# Patient Record
Sex: Male | Born: 1975 | Race: White | Hispanic: No | Marital: Married | Smoking: Never smoker
Health system: Southern US, Community
[De-identification: ages and names within clinical notes are randomized; demographics above are authoritative.]

## PROBLEM LIST (undated history)

## (undated) DIAGNOSIS — I639 Cerebral infarction, unspecified: Secondary | ICD-10-CM

## (undated) DIAGNOSIS — E11319 Type 2 diabetes mellitus with unspecified diabetic retinopathy without macular edema: Secondary | ICD-10-CM

## (undated) DIAGNOSIS — R2990 Unspecified symptoms and signs involving the nervous system: Secondary | ICD-10-CM

## (undated) DIAGNOSIS — I10 Essential (primary) hypertension: Secondary | ICD-10-CM

## (undated) DIAGNOSIS — E119 Type 2 diabetes mellitus without complications: Secondary | ICD-10-CM

## (undated) HISTORY — DX: Type 2 diabetes mellitus with unspecified diabetic retinopathy without macular edema: E11.319

## (undated) HISTORY — DX: Essential (primary) hypertension: I10

## (undated) HISTORY — DX: Cerebral infarction, unspecified: I63.9

## (undated) HISTORY — PX: CORNEAL CHELATION: SHX1399

---

## 2003-02-24 ENCOUNTER — Inpatient Hospital Stay (HOSPITAL_COMMUNITY): Admission: EM | Admit: 2003-02-24 | Discharge: 2003-02-27 | Payer: Self-pay | Admitting: Psychiatry

## 2003-03-05 ENCOUNTER — Inpatient Hospital Stay (HOSPITAL_COMMUNITY): Admission: EM | Admit: 2003-03-05 | Discharge: 2003-03-12 | Payer: Self-pay | Admitting: Psychiatry

## 2003-03-13 ENCOUNTER — Other Ambulatory Visit (HOSPITAL_COMMUNITY): Admission: RE | Admit: 2003-03-13 | Discharge: 2003-03-16 | Payer: Self-pay | Admitting: Psychiatry

## 2018-05-02 DIAGNOSIS — E119 Type 2 diabetes mellitus without complications: Secondary | ICD-10-CM

## 2018-09-08 DIAGNOSIS — I639 Cerebral infarction, unspecified: Secondary | ICD-10-CM

## 2018-09-08 HISTORY — DX: Cerebral infarction, unspecified: I63.9

## 2018-09-29 ENCOUNTER — Inpatient Hospital Stay (HOSPITAL_COMMUNITY): Payer: BLUE CROSS/BLUE SHIELD

## 2018-09-29 ENCOUNTER — Inpatient Hospital Stay (HOSPITAL_COMMUNITY)
Admission: AD | Admit: 2018-09-29 | Discharge: 2018-09-30 | DRG: 069 | Disposition: A | Discharge status: Home / Self Care | Payer: BLUE CROSS/BLUE SHIELD | Source: Other Acute Inpatient Hospital | Attending: Student in an Organized Health Care Education/Training Program | Admitting: Student in an Organized Health Care Education/Training Program

## 2018-09-29 DIAGNOSIS — E119 Type 2 diabetes mellitus without complications: Secondary | ICD-10-CM

## 2018-09-29 DIAGNOSIS — Z9282 Status post administration of tPA (rtPA) in a different facility within the last 24 hours prior to admission to current facility: Secondary | ICD-10-CM

## 2018-09-29 DIAGNOSIS — Z79899 Other long term (current) drug therapy: Secondary | ICD-10-CM | POA: Diagnosis not present

## 2018-09-29 DIAGNOSIS — E785 Hyperlipidemia, unspecified: Secondary | ICD-10-CM

## 2018-09-29 DIAGNOSIS — Z6833 Body mass index (BMI) 33.0-33.9, adult: Secondary | ICD-10-CM

## 2018-09-29 DIAGNOSIS — E669 Obesity, unspecified: Secondary | ICD-10-CM | POA: Diagnosis not present

## 2018-09-29 DIAGNOSIS — G459 Transient cerebral ischemic attack, unspecified: Secondary | ICD-10-CM

## 2018-09-29 DIAGNOSIS — G43909 Migraine, unspecified, not intractable, without status migrainosus: Secondary | ICD-10-CM | POA: Diagnosis not present

## 2018-09-29 DIAGNOSIS — I361 Nonrheumatic tricuspid (valve) insufficiency: Secondary | ICD-10-CM | POA: Diagnosis not present

## 2018-09-29 DIAGNOSIS — I639 Cerebral infarction, unspecified: Secondary | ICD-10-CM

## 2018-09-29 DIAGNOSIS — R2990 Unspecified symptoms and signs involving the nervous system: Secondary | ICD-10-CM | POA: Diagnosis present

## 2018-09-29 DIAGNOSIS — I370 Nonrheumatic pulmonary valve stenosis: Secondary | ICD-10-CM | POA: Diagnosis not present

## 2018-09-29 HISTORY — DX: Unspecified symptoms and signs involving the nervous system: R29.90

## 2018-09-29 HISTORY — DX: Type 2 diabetes mellitus without complications: E11.9

## 2018-09-29 HISTORY — DX: Cerebral infarction, unspecified: I63.9

## 2018-09-29 LAB — MRSA PCR SCREENING: MRSA by PCR: NEGATIVE

## 2018-09-29 MED ORDER — ACETAMINOPHEN 325 MG PO TABS
650.0000 mg | ORAL_TABLET | ORAL | Status: DC | PRN
  Administered 2018-09-29: 650 mg via ORAL
  Filled 2018-09-29: qty 2

## 2018-09-29 MED ORDER — ACETAMINOPHEN 160 MG/5ML PO SOLN: 650.0000 mg | ORAL | Status: DC | PRN

## 2018-09-29 MED ORDER — SENNOSIDES-DOCUSATE SODIUM 8.6-50 MG PO TABS: 1.0000 | ORAL_TABLET | Freq: Every evening | ORAL | Status: DC | PRN

## 2018-09-29 MED ORDER — STROKE: EARLY STAGES OF RECOVERY BOOK
Freq: Once | Status: AC
  Administered 2018-09-29: 20:00:00
  Filled 2018-09-29: qty 1

## 2018-09-29 MED ORDER — PANTOPRAZOLE SODIUM 40 MG IV SOLR
40.0000 mg | Freq: Every day | INTRAVENOUS | Status: DC
  Administered 2018-09-29: 40 mg via INTRAVENOUS
  Filled 2018-09-29: qty 40

## 2018-09-29 MED ORDER — IOPAMIDOL (ISOVUE-370) INJECTION 76%
100.0000 mL | Freq: Once | INTRAVENOUS | Status: AC | PRN
  Administered 2018-09-29: 100 mL via INTRAVENOUS

## 2018-09-29 MED ORDER — SODIUM CHLORIDE 0.9 % IV SOLN
INTRAVENOUS | Status: DC
  Administered 2018-09-29: 1000 mL via INTRAVENOUS
  Administered 2018-09-30: 11:00:00 via INTRAVENOUS

## 2018-09-29 MED ORDER — ACETAMINOPHEN 650 MG RE SUPP: 650.0000 mg | RECTAL | Status: DC | PRN

## 2018-09-29 NOTE — H&P (Addendum)
STROKE Post tPA Admission H&P   CC: Left-sided numbness and weakness  History is obtained from: Patient and chart  HPI: Alexander Mccormick is a 42 y.o. male past medical history of diabetes, who presented to the emergency room at [facility] for sudden onset of left-sided tingling, left upper and lower extremity heaviness, which had worsened today.  He reports he has been having some left-sided tingling and numbness for the past 3 weeks but today at around 10:45 AM he noticed a sudden worsening.  He also had per report a metallic taste in his mouth at the time.  He also complained of headache that at the emergency room at [facility] 6 out of 10. A code stroke was activated because of acute neurological change.  He was evaluated by telemedicine neurology and had a NIH stroke scale of 4-3 for left leg and 1 for sensation- discussion about IV TPA was conducted per notes by the telemedicine neurologist and the patient agreed for IV TPA.  He was given IV TPA and sent over to [facility] for post TPA care. He reports that he is not someone who gets migraines or headaches frequently.  He is a Naval [employer].  Does not smoke.  Denies drugs or alcohol history.  Denies recent stressors.  Denies chest pain shortness of breath nausea vomiting fevers chills or preceding illnesses.  His left-sided symptoms have been ongoing for 3 weeks but they were not as bothersome as they became around 1045 today for which she came to the ER.  He describes the symptoms today as "same feeling when your leg sleeps if you sit on it"-and described both tingling and numbness on the left arm and leg.  No facial involvement.  No speech involvement.  No family history of strokes in young   LKW: 1045 hrs 09/29/2018 tpa given?:  Adult hospital by telemedicine neurology at 1424 hrs. Premorbid modified Rankin scale (mRS): 0  ROS: ROS was performed and is negative except as noted in the HPI.  No past medical history on  file. Diabetes  No family history on file. No family history of strokes or heart attacks  Social History:   has no history on file for tobacco, alcohol, and drug. Denies alcohol tobacco or drug use  Medications  Current Facility-Administered Medications:  .   stroke: mapping our early stages of recovery book, , Does not apply, Once, Milon Dikes, MD .  0.9 %  sodium chloride infusion, , Intravenous, Continuous, Milon Dikes, MD .  acetaminophen (TYLENOL) tablet 650 mg, 650 mg, Oral, Q4H PRN **OR** acetaminophen (TYLENOL) solution 650 mg, 650 mg, Per Tube, Q4H PRN **OR** acetaminophen (TYLENOL) suppository 650 mg, 650 mg, Rectal, Q4H PRN, Milon Dikes, MD .  pantoprazole (PROTONIX) injection 40 mg, 40 mg, Intravenous, QHS, Milon Dikes, MD .  senna-docusate (Senokot-S) tablet 1 tablet, 1 tablet, Oral, QHS PRN, Milon Dikes, MD  Exam: Current vital signs: BP (!) 127/96   Pulse (!) 109   Resp (!) 34   SpO2 94%  Vital signs in last 24 hours: Pulse Rate:  [104-109] 109 (12/22 1945) Resp:  [22-34] 34 (12/22 1945) BP: (127-133)/(92-96) 127/96 (12/22 1945) SpO2:  [94 %-95 %] 94 % (12/22 1945)  GENERAL: Awake, alert in NAD HEENT: - Normocephalic and atraumatic, dry mm, no LN++, no Thyromegally LUNGS - Clear to auscultation bilaterally with no wheezes CV - S1S2 RRR, no m/r/g, equal pulses bilaterally. ABDOMEN - Soft, nontender, nondistended with normoactive BS Ext: warm, well perfused, intact  peripheral pulses, no edema  NEURO:  Mental Status: AA&Ox3  Language: speech is non-dysarthric.  Naming, repetition, fluency, and comprehension intact. Cranial Nerves: PERRL. EOMI, visual fields full, no facial asymmetry, decreased to touch pinprick and vibration with a sharp cut off in the midline.  Intact to temperature.  Hearing intact, tongue/uvula/soft palate midline, normal sternocleidomastoid and trapezius muscle strength. No evidence of tongue atrophy or fibrillations Motor: Left  upper extremity 4/5 with no vertical drift on coaching.  Left lower extremity barely antigravity and falls to the bed before 5 seconds.  Right upper and lower extremities normal strength tone bulk and range of motion.  Caveat-there is effort dependent weakness on the left side. Sensation-decreased sensation left arm and leg compared to the right to light touch, vibration and pinprick.  Temperature exam is inconsistent.  Sensation is decreased to all modalities with a sharp cut off in the midline. Also on the lower extremity complains of sharply decreased sensation on the left leg from his toes up to mid calf and then some improvement in sensation.-Very inconsistent exam Coordination: Finger-to-nose intact bilaterally heel-knee-shin intact on the right, unable to do on the left. Gait- deferred No clonus in bilateral lower extremities 1+ ankle jerks.  Absent knee jerks bilaterally.  Absent biceps and brachioradialis reflexes.  NIHSS 1a Level of Conscious.: 0 1b LOC Questions: 0 1c LOC Commands: 0 2 Best Gaze: 0 3 Visual: 0 4 Facial Palsy: 0 5a Motor Arm - left: 0 5b Motor Arm - Right: 0 6a Motor Leg - Left: 2 6b Motor Leg - Right: 0 7 Limb Ataxia: 0 8 Sensory: 2 9 Best Language: 0 10 Dysarthria: 0 11 Extinct. and Inatten.: 0 TOTAL: 4  Labs I have reviewed labs in epic and the results pertinent to this consultation are: Labs available from our hospital yet. Chart review from [facility] with labs as below: WBC 6.3, hemoglobin 16.9, hematocrit 49.8, platelet count 279, INR 1.0, PT 10.3, APTT 26.1. Sodium 139 potassium 4.5, chloride 102, anion gap 15, carbon dioxide 27 BUN 18 creatinine 1.0 estimated GFR greater than 60 glucose 114 calcium 9.6 total bilirubin 0.6 AST 28 ALT 26 alkaline phosphatase 93 troponin less than 0.01 total protein 8.3 albumin 4.7  Imaging I have reviewed the images obtained: [facility].  No bleed  Assessment:  42 year old man past  history of diabetes presenting [facility] with acute worsening of 3 weeks worth of left-sided tingling and numbness that happened around 10:45 AM. He was seen by telemedicine neurology and IV TPA was given for left-sided weakness on exam with an NIH stroke scale of 4. On my examination, he does have some left lower extremity weakness, and decreased sensation on the left but the exam has some inconsistencies. Also had some headache associated with the symptoms in the morning. Differentials include complex migraine versus stroke versus conversion He has been given IV TPA and we will pursue with post TPA care for now  Imaging by MRI will be useful in establishing a diagnosis  Impression: Evaluate for acute ischemic stroke-status post IV TPA at an outside hospital Left-sided weakness Diabetes Evaluate for complex migraine Evaluate for nonorganic causes of weakness  Plan:  Acute Ischemic Stroke-likely small vessel etiology versus complex migraine  Acuity: Acute Continue Evaluation:  -Admit to: Neuro ICU -Hold Aspirin until 24 hour post tPA neuroimaging is stable and without evidence of bleeding -Blood pressure control, goal of SYS < -MRI/ECHO/A1C/Lipid panel. CTA head neck routine -  Hyperglycemia management per SSI to maintain glucose 140-180mg /dL. -PT/OT/ST therapies and recommendations when able  CNS -Close neuro monitoring -N.p.o. until cleared by bedside swallow evaluation  Hemiplegia and hemiparesis following cerebral infarction affecting left non-dominant side  -PT/OT -PM&R consult  RESP No acute issues Monitor clinically  CV No acute issues Blood pressures per post TPA protocol systolic less than 180  Hyperlipidemia, unspecified  - Statin for goal LDL < 70  HEME Active issues -Monitor -transfuse for hgb < 7  ENDO Type 2 diabetes mellitus with hyperglycemia  -SSI -goal HgbA1c < 7  GI/GU -Gentle hydration  Fluid/Electrolyte Disorders No active  issues Trend labs and replete as necessary  ID No clinical complaint suggestive of any infection Obtain chest x-ray as aspiration pneumonia was remains a concern in stroke patients  Prophylaxis DVT: SCDs   GI: NA Bowel:doc senaa   Diet: NPO until cleared by speech or bedside swallow eval  Code Status: Full Code   THE FOLLOWING WERE PRESENT ON ADMISSION: Acute ischemic stroke Hemiparesis Diabetes  -- Milon DikesAshish Niquita Digioia, MD [facility] Pager: [phone] If 7pm to 7am, please call on call as listed on AMION.   CRITICAL CARE ATTESTATION Performed by: Milon DikesAshish Aunna Snooks, MD Total critical care time: 45 minutes Critical care time was exclusive of separately billable procedures and treating other patients and/or supervising APPs/Residents/Students Critical care was necessary to treat or prevent imminent or life-threatening deterioration due to acute ischemic stroke, left hemiparesis. This patient is critically ill and at significant risk for neurological worsening and/or death and care requires constant monitoring. Critical care was time spent personally by me on the following activities: development of treatment plan with patient and/or surrogate as well as nursing, discussions with consultants, evaluation of patient's response to treatment, examination of patient, obtaining history from patient or surrogate, ordering and performing treatments and interventions, ordering and review of laboratory studies, ordering and review of radiographic studies, pulse oximetry, re-evaluation of patient's condition, participation in multidisciplinary rounds and medical decision making of high complexity in the care of this patient.

## 2018-09-30 ENCOUNTER — Other Ambulatory Visit: Payer: Self-pay

## 2018-09-30 ENCOUNTER — Inpatient Hospital Stay (HOSPITAL_COMMUNITY): Payer: BLUE CROSS/BLUE SHIELD

## 2018-09-30 ENCOUNTER — Encounter (HOSPITAL_COMMUNITY): Payer: Self-pay | Admitting: Nurse Practitioner

## 2018-09-30 DIAGNOSIS — I370 Nonrheumatic pulmonary valve stenosis: Secondary | ICD-10-CM

## 2018-09-30 DIAGNOSIS — E785 Hyperlipidemia, unspecified: Secondary | ICD-10-CM

## 2018-09-30 DIAGNOSIS — E119 Type 2 diabetes mellitus without complications: Secondary | ICD-10-CM

## 2018-09-30 DIAGNOSIS — I361 Nonrheumatic tricuspid (valve) insufficiency: Secondary | ICD-10-CM

## 2018-09-30 DIAGNOSIS — G43909 Migraine, unspecified, not intractable, without status migrainosus: Secondary | ICD-10-CM | POA: Diagnosis present

## 2018-09-30 LAB — GLUCOSE, CAPILLARY
GLUCOSE-CAPILLARY: 82 mg/dL (ref 70–99)
Glucose-Capillary: 107 mg/dL — ABNORMAL HIGH (ref 70–99)
Glucose-Capillary: 118 mg/dL — ABNORMAL HIGH (ref 70–99)

## 2018-09-30 LAB — HEMOGLOBIN A1C
Hgb A1c MFr Bld: 6.2 % — ABNORMAL HIGH (ref 4.8–5.6)
Mean Plasma Glucose: 131.24 mg/dL

## 2018-09-30 LAB — LIPID PANEL
CHOLESTEROL: 137 mg/dL (ref 0–200)
HDL: 39 mg/dL — ABNORMAL LOW (ref >40)
LDL Cholesterol: 85 mg/dL (ref 0–99)
Total CHOL/HDL Ratio: 3.5 RATIO
Triglycerides: 63 mg/dL (ref <150)
VLDL: 13 mg/dL (ref 0–40)

## 2018-09-30 LAB — ECHOCARDIOGRAM COMPLETE
Height: 66 in
Weight: 3343.94 oz

## 2018-09-30 LAB — HIV ANTIBODY (ROUTINE TESTING W REFLEX): HIV Screen 4th Generation wRfx: NONREACTIVE

## 2018-09-30 MED ORDER — ASPIRIN 81 MG PO TBEC: 81.0000 mg | DELAYED_RELEASE_TABLET | Freq: Every day | ORAL | Status: AC

## 2018-09-30 MED ORDER — PRAVASTATIN SODIUM 40 MG PO TABS
40.0000 mg | ORAL_TABLET | Freq: Every day | ORAL | Status: DC
  Administered 2018-09-30: 40 mg via ORAL
  Filled 2018-09-30: qty 1

## 2018-09-30 MED ORDER — PERFLUTREN LIPID MICROSPHERE
1.0000 mL | INTRAVENOUS | Status: AC | PRN
  Administered 2018-09-30: 3 mL via INTRAVENOUS
  Filled 2018-09-30: qty 10

## 2018-09-30 MED ORDER — ASPIRIN EC 81 MG PO TBEC
81.0000 mg | DELAYED_RELEASE_TABLET | Freq: Every day | ORAL | Status: DC
  Administered 2018-09-30: 81 mg via ORAL
  Filled 2018-09-30: qty 1

## 2018-09-30 NOTE — Progress Notes (Addendum)
OT Cancellation Note  Patient Details Name: Alexander Mccormick MRN: [id number] DOB: 29-Feb-1976   Cancelled Treatment:    Reason Eval/Treat Not Completed: Patient not medically ready(TPA 12/22 1424 strict bedrest orders)  OT order received and appreciated however this conflicts with current bedrest order set. Please increase activity tolerance as appropriate and remove bedrest from orders. . Please contact OT at [phone] if bed rest order is discontinued. OT will hold evaluation at this time and will check back as time allows pending increased activity orders.   Fontaine NoBrynn Jabril Pursell   Brynn Feliberto Stockley, OTR/L  Acute Rehabilitation Services Pager: [phone] Office: [phone] .  09/30/2018, 7:42 AM

## 2018-09-30 NOTE — Progress Notes (Signed)
SLP Cancellation Note  Patient Details Name: Alexander Mccormick MRN: [id number] DOB: Dec 26, 1975   Cancelled treatment:       Reason Eval/Treat Not Completed: Patient at procedure or test/unavailable   Blenda MountsCouture, Shana Younge Laurice 09/30/2018, 11:18 AM

## 2018-09-30 NOTE — Evaluation (Signed)
Occupational Therapy Evaluation Patient Details Name: Alexander Mccormick MRN: [id number] DOB: [birth year] Today's Date: 09/30/2018    History of Present Illness 42 year old man past history of diabetes [facility] with acute worsening of 3 weeks worth of left-sided tingling and numbness that happened around 10:45 AM.   Clinical Impression   PT admitted with see above ( pending results on MRI ). Pt currently with functional limitiations due to the deficits listed below (see OT problem list). Pt with L side weakness and decr coordination of L UE.  Pt will benefit from skilled OT to increase their independence and safety with adls and balance to allow discharge outpatient.     Follow Up Recommendations  Outpatient OT    Equipment Recommendations  None recommended by OT    Recommendations for Other Services       Precautions / Restrictions Precautions Precautions: Fall Restrictions Weight Bearing Restrictions: No      Mobility Bed Mobility Overal bed mobility: Modified Independent             General bed mobility comments: no difficulty getting up from level bed  Transfers Overall transfer level: Needs assistance Equipment used: None Transfers: Sit to/from Stand Sit to Stand: Min assist         General transfer comment: pt with L knee instability intially    Balance Overall balance assessment: Mild deficits observed, not formally tested                               Standardized Balance Assessment Standardized Balance Assessment : Dynamic Gait Index   Dynamic Gait Index Level Surface: Mild Impairment Change in Gait Speed: Mild Impairment Gait with Horizontal Head Turns: Mild Impairment Gait with Vertical Head Turns: Mild Impairment Gait and Pivot Turn: Mild Impairment Step Over Obstacle: Mild Impairment Step Around Obstacles: Mild Impairment Steps: Mild Impairment Total Score: 16     ADL either performed or assessed with  clinical judgement   ADL Overall ADL's : Needs assistance/impaired Eating/Feeding: Set up;Sitting Eating/Feeding Details (indicate cue type and reason): decr ability to open Grooming: Wash/dry face;Set up   Upper Body Bathing: Set up   Lower Body Bathing: Min guard           Toilet Transfer: Minimal assistance           Functional mobility during ADLs: Minimal assistance       Vision         Perception     Praxis      Pertinent Vitals/Pain Pain Assessment: No/denies pain     Hand Dominance Right   Extremity/Trunk Assessment Upper Extremity Assessment Upper Extremity Assessment: LUE deficits/detail LUE Deficits / Details: decr fine motor gross motor, decr coordination, undershooting, incr time to touch hand to nose LUE Sensation: WNL LUE Coordination: decreased fine motor;decreased gross motor   Lower Extremity Assessment Lower Extremity Assessment: Defer to PT evaluation LLE Deficits / Details: grossly 3/5 LLE Coordination: decreased gross motor   Cervical / Trunk Assessment Cervical / Trunk Assessment: Normal   Communication Communication Communication: No difficulties   Cognition Arousal/Alertness: Awake/alert Behavior During Therapy: WFL for tasks assessed/performed Overall Cognitive Status: Within Functional Limits for tasks assessed                                     General Comments  VSS  Exercises     Shoulder Instructions      Home Living Family/patient expects to be discharged to:: Private residence Living Arrangements: Spouse/significant other Available Help at Discharge: Family;Available 24 hours/day(can be 24/7, wife does work) Type of Home: House Home Access: Stairs to enter Secretary/administratorntrance Stairs-Number of Steps: 2 Entrance Stairs-Rails: None Home Layout: One level     Bathroom Shower/Tub: Chief Strategy OfficerTub/shower unit   Bathroom Toilet: Standard     Home Equipment: None          Prior Functioning/Environment Level  of Independence: Independent        Comments: drive truck locally        OT Problem List: Decreased strength;Decreased range of motion;Decreased activity tolerance;Impaired balance (sitting and/or standing);Impaired UE functional use      OT Treatment/Interventions: Self-care/ADL training;Therapeutic exercise;Neuromuscular education;DME and/or AE instruction;Manual therapy;Therapeutic activities;Balance training;Patient/family education    OT Goals(Current goals can be found in the care plan section) Acute Rehab OT Goals Patient Stated Goal: home OT Goal Formulation: With patient Time For Goal Achievement: 10/14/18 Potential to Achieve Goals: Good  OT Frequency: Min 2X/week   Barriers to D/C:            Co-evaluation PT/OT/SLP Co-Evaluation/Treatment: Yes Reason for Co-Treatment: For patient/therapist safety   OT goals addressed during session: ADL's and self-care;Proper use of Adaptive equipment and DME;Strengthening/ROM      AM-PAC OT "6 Clicks" Daily Activity     Outcome Measure Help from another person eating meals?: A Little Help from another person taking care of personal grooming?: A Little Help from another person toileting, which includes using toliet, bedpan, or urinal?: A Little Help from another person bathing (including washing, rinsing, drying)?: A Little Help from another person to put on and taking off regular upper body clothing?: A Little Help from another person to put on and taking off regular lower body clothing?: A Little 6 Click Score: 18   End of Session Equipment Utilized During Treatment: Gait belt Nurse Communication: Mobility status;Precautions  Activity Tolerance: Patient tolerated treatment well Patient left: in chair;with call bell/phone within reach;with chair alarm set  OT Visit Diagnosis: Unsteadiness on feet (R26.81);Muscle weakness (generalized) (M62.81)                Time: [id number] OT Time Calculation (min): 22 min Charges:   OT General Charges $OT Visit: 1 Visit OT Evaluation $OT Eval Moderate Complexity: 1 Mod   Alexander Mccormick, OTR/L  Acute Rehabilitation Services Pager: [phone] Office: [phone] .   Alexander FlowBrynn Khadim Lundberg 09/30/2018, 1:56 PM

## 2018-09-30 NOTE — Evaluation (Signed)
Physical Therapy Evaluation Patient Details Name: Alexander Mccormick MRN: [id number] DOB: 1976/05/08 Today's Date: 09/30/2018   History of Present Illness  42 year old man past history of diabetes [facility] with acute worsening of 3 weeks worth of left-sided tingling and numbness that happened around 10:45 AM.  Clinical Impression  Pt presenting with L UE and LE weakness, impaired co-ordination, sequence and reaction time. Pt is able to ambulate without AD and complete stair negotiation. Pt was indep and driving truck PTA. Pt to benefit from outpt if L UE/LE doesn't improve by the end of the week. Acute PT to cont to follow.    Follow Up Recommendations Outpatient PT;Supervision/Assistance - 24 hour(24/7 initially)    Equipment Recommendations  None recommended by PT    Recommendations for Other Services       Precautions / Restrictions Precautions Precautions: Fall Restrictions Weight Bearing Restrictions: No      Mobility  Bed Mobility Overal bed mobility: Modified Independent             General bed mobility comments: no difficulty getting up from level bed  Transfers Overall transfer level: Needs assistance Equipment used: None Transfers: Sit to/from Stand Sit to Stand: Min assist         General transfer comment: pt with L knee instability intially  Ambulation/Gait Ambulation/Gait assistance: Min guard;Min assist Gait Distance (Feet): 200 Feet Assistive device: None Gait Pattern/deviations: Step-through pattern;Decreased stride length Gait velocity: dec Gait velocity interpretation: >2.62 ft/sec, indicative of community ambulatory General Gait Details: pt initially with L knee instability requiring min/modA to prevent L knee buckling initially progressing to ambulation without AD or assist, without L knee buckling  Stairs Stairs: Yes Stairs assistance: Min guard Stair Management: Step to pattern;Forwards Number of Stairs: 2(to mimic  home set up) General stair comments: educated on ascending with R LE and descending with L LE  Wheelchair Mobility    Modified Rankin (Stroke Patients Only) Modified Rankin (Stroke Patients Only) Pre-Morbid Rankin Score: No significant disability Modified Rankin: Slight disability     Balance                                 Standardized Balance Assessment Standardized Balance Assessment : Dynamic Gait Index   Dynamic Gait Index Level Surface: Mild Impairment Change in Gait Speed: Mild Impairment Gait with Horizontal Head Turns: Mild Impairment Gait with Vertical Head Turns: Mild Impairment Gait and Pivot Turn: Mild Impairment Step Over Obstacle: Mild Impairment Step Around Obstacles: Mild Impairment Steps: Mild Impairment Total Score: 16       Pertinent Vitals/Pain Pain Assessment: No/denies pain    Home Living Family/patient expects to be discharged to:: Private residence Living Arrangements: Spouse/significant other Available Help at Discharge: Family;Available 24 hours/day(can be 24/7, wife does work) Type of Home: House Home Access: Stairs to enter Entrance Stairs-Rails: None Secretary/administratorntrance Stairs-Number of Steps: 2 Home Layout: One level Home Equipment: None      Prior Function Level of Independence: Independent         Comments: drive truck locally     Hand Dominance   Dominant Hand: Right    Extremity/Trunk Assessment   Upper Extremity Assessment Upper Extremity Assessment: Defer to OT evaluation    Lower Extremity Assessment Lower Extremity Assessment: LLE deficits/detail LLE Deficits / Details: grossly 3/5 LLE Coordination: decreased gross motor    Cervical / Trunk Assessment Cervical / Trunk Assessment: Normal  Communication  Communication: No difficulties  Cognition Arousal/Alertness: Awake/alert Behavior During Therapy: WFL for tasks assessed/performed Overall Cognitive Status: Within Functional Limits for tasks  assessed                                        General Comments General comments (skin integrity, edema, etc.): VSS    Exercises     Assessment/Plan    PT Assessment Patient needs continued PT services  PT Problem List Decreased strength;Decreased balance;Decreased mobility;Decreased coordination       PT Treatment Interventions DME instruction;Gait training;Stair training;Therapeutic exercise;Therapeutic activities;Functional mobility training;Balance training;Neuromuscular re-education    PT Goals (Current goals can be found in the Care Plan section)  Acute Rehab PT Goals Patient Stated Goal: home PT Goal Formulation: With patient Time For Goal Achievement: 10/07/18 Potential to Achieve Goals: Good Additional Goals Additional Goal #1: Pt to score >49 on Berg to indicate minimal falls risk.    Frequency Min 4X/week   Barriers to discharge        Co-evaluation PT/OT/SLP Co-Evaluation/Treatment: Yes Reason for Co-Treatment: Complexity of the patient's impairments (multi-system involvement)           AM-PAC PT "6 Clicks" Mobility  Outcome Measure Help needed turning from your back to your side while in a flat bed without using bedrails?: A Little Help needed moving from lying on your back to sitting on the side of a flat bed without using bedrails?: A Little Help needed moving to and from a bed to a chair (including a wheelchair)?: A Little Help needed standing up from a chair using your arms (e.g., wheelchair or bedside chair)?: A Little Help needed to walk in hospital room?: A Little Help needed climbing 3-5 steps with a railing? : A Little 6 Click Score: 18    End of Session Equipment Utilized During Treatment: Gait belt Activity Tolerance: Patient tolerated treatment well Patient left: in chair;with call bell/phone within reach Nurse Communication: Mobility status PT Visit Diagnosis: Unsteadiness on feet (R26.81)    Time: 1253-1316 PT  Time Calculation (min) (ACUTE ONLY): 23 min   Charges:   PT Evaluation $PT Eval Moderate Complexity: 1 Mod          Alexander Mccormick, PT, DPT Acute Rehabilitation Services Pager #: [phone] Office #: [phone]   Alexander Mccormick 09/30/2018, 1:37 PM

## 2018-09-30 NOTE — Discharge Summary (Addendum)
Discharge Summary  Patient ID: Alexander Mccormick    l   MRN: [id number]      DOB: 02-02-1976  Date of Admission: 09/29/2018 Date of Discharge: 09/30/2018  Attending Physician:  Milon DikesArora, Ashish, MD, Stroke MD Consultant(s):    None  Patient's PCP:  No primary care provider on file.  DISCHARGE DIAGNOSIS:  Principal Problem:   Stroke-like episode (HCC) s/p IV tPA Active Problems:   Diabetes (HCC)   Hyperlipemia   History reviewed. No pertinent past medical history. History reviewed. No pertinent surgical history.  Allergies as of 09/30/2018      Reactions   Sulfamethoxazole-trimethoprim Rash      Medication List    TAKE these medications   aspirin 81 MG EC tablet Take 1 tablet (81 mg total) by mouth daily.   cholecalciferol 25 MCG (1000 UT) tablet Commonly known as:  VITAMIN D Take 1,000 Units by mouth daily.   pioglitazone 45 MG tablet Commonly known as:  ACTOS Take 45 mg by mouth daily.   pravastatin 40 MG tablet Commonly known as:  PRAVACHOL Take 40 mg by mouth daily.   XIGDUO XR 02-999 MG Tb24 Generic drug:  Dapagliflozin-metFORMIN HCl ER Take 1 tablet by mouth daily.       LABORATORY STUDIES CBC No results found for: WBC, RBC, HGB, HCT, PLT, MCV, MCH, MCHC, RDW, LYMPHSABS, MONOABS, EOSABS, BASOSABS CMPNo results found for: NA, K, CL, CO2, GLUCOSE, BUN, CREATININE, CALCIUM, PROT, ALBUMIN, AST, ALT, ALKPHOS, BILITOT, GFRNONAA, GFRAA COAGSNo results found for: INR, PROTIME Lipid Panel    Component Value Date/Time   CHOL 137 09/30/2018 0106   TRIG 63 09/30/2018 0106   HDL 39 (L) 09/30/2018 0106   CHOLHDL 3.5 09/30/2018 0106   VLDL 13 09/30/2018 0106   LDLCALC 85 09/30/2018 0106   HgbA1C  Lab Results  Component Value Date   HGBA1C 6.2 (H) 09/30/2018   Urinalysis No results found for: COLORURINE, APPEARANCEUR, LABSPEC, PHURINE, GLUCOSEU, HGBUR, BILIRUBINUR, KETONESUR, PROTEINUR, UROBILINOGEN, NITRITE, LEUKOCYTESUR Urine Drug Screen No results found for:  LABOPIA, COCAINSCRNUR, LABBENZ, AMPHETMU, THCU, LABBARB  Alcohol Level No results found for: [facility]   SIGNIFICANT DIAGNOSTIC STUDIES Ct Angio Head W Or Wo Contrast  Result Date: 09/30/2018 CLINICAL DATA:  LEFT extremity numbness and tingling, neck pain. EXAM: CT ANGIOGRAPHY HEAD AND NECK TECHNIQUE: Multidetector CT imaging of the head and neck was performed using the standard protocol during bolus administration of intravenous contrast. Multiplanar CT image reconstructions and MIPs were obtained to evaluate the vascular anatomy. Carotid stenosis measurements (when applicable) are obtained utilizing NASCET criteria, using the distal internal carotid diameter as the denominator. CONTRAST:  100mL ISOVUE-370 IOPAMIDOL (ISOVUE-370) INJECTION 76% COMPARISON:  CT HEAD September 29, 2018 at 1339 hours FINDINGS: CT HEAD FINDINGS BRAIN: No intraparenchymal hemorrhage, mass effect nor midline shift. The ventricles and sulci are normal. No acute large vascular territory infarcts. No abnormal extra-axial fluid collections. Basal cisterns are patent. VASCULAR: Unremarkable. SKULL/SOFT TISSUES: No skull fracture. RIGHT frontal calvarial hemangioma. No significant soft tissue swelling. ORBITS/SINUSES: The included ocular globes and orbital contents are normal.Mild paranasal sinus mucosal thickening. Mastoid air cells are well aerated. OTHER: None. CTA NECK FINDINGS: AORTIC ARCH: Normal appearance of the thoracic arch, normal branch pattern. The origins of the innominate, left Common carotid artery and subclavian artery are widely patent. RIGHT CAROTID SYSTEM: Common carotid artery is patent. Normal appearance of the carotid bifurcation without hemodynamically significant stenosis by NASCET criteria. Normal appearance of the internal carotid artery. LEFT CAROTID SYSTEM: Common  carotid artery is patent. Normal appearance of the carotid bifurcation without hemodynamically significant stenosis by NASCET criteria. Normal appearance  of the internal carotid artery. VERTEBRAL ARTERIES:Codominant vertebral arteries. Normal appearance of the vertebral arteries, widely patent. SKELETON: No acute osseous process though bone windows have not been submitted. OTHER NECK: Soft tissues of the neck are nonacute though, not tailored for evaluation. UPPER CHEST: Included lung apices are clear. No superior mediastinal lymphadenopathy. CTA HEAD FINDINGS: ANTERIOR CIRCULATION: Patent cervical internal carotid arteries, petrous, cavernous and supra clinoid internal carotid arteries. Patent anterior communicating artery. Patent anterior and middle cerebral arteries. No large vessel occlusion, significant stenosis, contrast extravasation or aneurysm. POSTERIOR CIRCULATION: Patent vertebral arteries, vertebrobasilar junction and basilar artery, as well as main branch vessels. Patent posterior cerebral arteries. Small bilateral posterior communicating arteries present. No large vessel occlusion, significant stenosis, contrast extravasation or aneurysm. VENOUS SINUSES: Major dural venous sinuses are patent though not tailored for evaluation on this angiographic examination. ANATOMIC VARIANTS: None. DELAYED PHASE: Not performed. MIP images reviewed. IMPRESSION: 1. Normal CT HEAD with and without contrast. 2. Normal CTA NECK. 3. Normal CTA HEAD. Electronically Signed   By: Awilda Metroourtnay  Bloomer M.D.   On: 09/30/2018 00:06   Ct Angio Neck W Or Wo Contrast  Result Date: 09/30/2018 CLINICAL DATA:  LEFT extremity numbness and tingling, neck pain. EXAM: CT ANGIOGRAPHY HEAD AND NECK TECHNIQUE: Multidetector CT imaging of the head and neck was performed using the standard protocol during bolus administration of intravenous contrast. Multiplanar CT image reconstructions and MIPs were obtained to evaluate the vascular anatomy. Carotid stenosis measurements (when applicable) are obtained utilizing NASCET criteria, using the distal internal carotid diameter as the denominator.  CONTRAST:  100mL ISOVUE-370 IOPAMIDOL (ISOVUE-370) INJECTION 76% COMPARISON:  CT HEAD September 29, 2018 at 1339 hours FINDINGS: CT HEAD FINDINGS BRAIN: No intraparenchymal hemorrhage, mass effect nor midline shift. The ventricles and sulci are normal. No acute large vascular territory infarcts. No abnormal extra-axial fluid collections. Basal cisterns are patent. VASCULAR: Unremarkable. SKULL/SOFT TISSUES: No skull fracture. RIGHT frontal calvarial hemangioma. No significant soft tissue swelling. ORBITS/SINUSES: The included ocular globes and orbital contents are normal.Mild paranasal sinus mucosal thickening. Mastoid air cells are well aerated. OTHER: None. CTA NECK FINDINGS: AORTIC ARCH: Normal appearance of the thoracic arch, normal branch pattern. The origins of the innominate, left Common carotid artery and subclavian artery are widely patent. RIGHT CAROTID SYSTEM: Common carotid artery is patent. Normal appearance of the carotid bifurcation without hemodynamically significant stenosis by NASCET criteria. Normal appearance of the internal carotid artery. LEFT CAROTID SYSTEM: Common carotid artery is patent. Normal appearance of the carotid bifurcation without hemodynamically significant stenosis by NASCET criteria. Normal appearance of the internal carotid artery. VERTEBRAL ARTERIES:Codominant vertebral arteries. Normal appearance of the vertebral arteries, widely patent. SKELETON: No acute osseous process though bone windows have not been submitted. OTHER NECK: Soft tissues of the neck are nonacute though, not tailored for evaluation. UPPER CHEST: Included lung apices are clear. No superior mediastinal lymphadenopathy. CTA HEAD FINDINGS: ANTERIOR CIRCULATION: Patent cervical internal carotid arteries, petrous, cavernous and supra clinoid internal carotid arteries. Patent anterior communicating artery. Patent anterior and middle cerebral arteries. No large vessel occlusion, significant stenosis, contrast  extravasation or aneurysm. POSTERIOR CIRCULATION: Patent vertebral arteries, vertebrobasilar junction and basilar artery, as well as main branch vessels. Patent posterior cerebral arteries. Small bilateral posterior communicating arteries present. No large vessel occlusion, significant stenosis, contrast extravasation or aneurysm. VENOUS SINUSES: Major dural venous sinuses are patent though not tailored for  evaluation on this angiographic examination. ANATOMIC VARIANTS: None. DELAYED PHASE: Not performed. MIP images reviewed. IMPRESSION: 1. Normal CT HEAD with and without contrast. 2. Normal CTA NECK. 3. Normal CTA HEAD. Electronically Signed   By: Awilda Metro M.D.   On: 09/30/2018 00:06   Mr Brain Wo Contrast  Result Date: 09/30/2018 CLINICAL DATA:  Left-sided tingling with upper and lower extremity weakness. EXAM: MRI HEAD WITHOUT CONTRAST TECHNIQUE: Multiplanar, multiecho pulse sequences of the brain and surrounding structures were obtained without intravenous contrast. COMPARISON:  Head CT 09/29/2018 FINDINGS: BRAIN: There is no acute infarct, acute hemorrhage, hydrocephalus or extra-axial collection. The midline structures are normal. No midline shift or other mass effect. There are no old infarcts. The white matter signal is normal for the patient's age. The cerebral and cerebellar volume are age-appropriate. Susceptibility-sensitive sequences show no chronic microhemorrhage or superficial siderosis. VASCULAR: Major intracranial arterial and venous sinus flow voids are normal. SKULL AND UPPER CERVICAL SPINE: Calvarial bone marrow signal is normal. There is no skull base mass. Visualized upper cervical spine and soft tissues are normal. SINUSES/ORBITS: No fluid levels or advanced mucosal thickening. No mastoid or middle ear effusion. The orbits are normal. IMPRESSION: Normal brain MRI. Electronically Signed   By: Deatra Robinson M.D.   On: 09/30/2018 14:24    2D Echocardiogram  - Left ventricle:  Infero basal hypokinesis The cavity size wasnormal. There was mild focal basal hypertrophy of the septum.Systolic function was normal. The estimated ejection fraction wasin the range of 50% to 55%. Wall motion was normal; there were noregional wall motion abnormalities. Left ventricular diastolicfunction parameters were normal. - Atrial septum: No defect or patent foramen ovale was identified.  Exam: NAD, pleasant                  Speech:    Speech is normal; fluent and spontaneous with normal comprehension.  Cognition:    The patient is oriented to person, place, and time;     recent and remote memory intact;     language fluent;    Cranial Nerves:    The pupils are equal, round, and reactive to light.Trigeminal sensation is intact and the muscles of mastication are normal. The face is symmetric. The palate elevates in the midline. Hearing intact. Voice is normal. Shoulder shrug is normal. The tongue has normal motion without fasciculations.   Coordination:  No dysmetria  Motor Observation:    No asymmetry, no atrophy, and no involuntary movements noted. Tone:    Normal muscle tone.     Strength: Mild left-sided weakness with giveway and poor effort.  Strength is V/V in the upper and lower limbs.      Sensation: intact to LT      HISTORY OF PRESENT ILLNESS Alexander Mccormick is a 42 y.o. male past medical history of diabetes, who presented to the emergency room at [facility] for sudden onset of left-sided tingling, left upper and lower extremity heaviness, which had worsened today.  He reports he has been having some left-sided tingling and numbness for the past 3 weeks but today at around 10:45 AM he noticed a sudden worsening.  He also had per report a metallic taste in his mouth at the time.  He also complained of headache that at the emergency room at [facility] 6 out of 10. A code stroke was activated because of acute neurological change.  He was evaluated by  telemedicine neurology and had a NIH stroke scale of 4-3 for left  leg and 1 for sensation- discussion about IV TPA was conducted per notes by the telemedicine neurologist and the patient agreed for IV TPA.  He was LKW: 01/28/2018. He received IV tpa at [facility] by telemedicine neurology at 1424 hrs on 09/29/2018. He was given IV TPA and sent over to [facility] for post TPA care.   He reports that he is not someone who gets migraines or headaches frequently.  He is a [employer].  Does not smoke.  Denies drugs or alcohol history.  Denies recent stressors.  Denies chest pain shortness of breath nausea vomiting fevers chills or preceding illnesses. His left-sided symptoms have been ongoing for 3 weeks but they were not as bothersome as they became around 1045 today for which she came to the ER.  He describes the symptoms today as "same feeling when your leg sleeps if you sit on it"-and described both tingling and numbness on the left arm and leg.  No facial involvement.  No speech involvement.  No family history of strokes in young. Premorbid modified Rankin scale (mRS): 0.  HOSPITAL COURSE Alexander Mccormick is a 42 y.o. male with history of diabetes and infrequent migraines presenting with L sided numbness/tingling, L sided heaviness. S/p IV tPA 09/29/2018 at 1424.    Stroke-like symptoms s/p IV tPA possibly complicated migraine  CT head ok at [facility]  CT at [facility] normal with and without contrast  CTA head & neck both normal  MRI  normal  2D Echo  EF 50-55%. No source of embolus   LDL 85  HgbA1c 6.2  No antithrombotic prior to admission, now on No antithrombotic in hospital as within 24h of tPA administration . Add aspirin low dose for secondary stroke prevention at discharge  Therapy recommendations:  OP PT and OT recommended and arranged at d/c  Disposition:  Return home  Hyperlipidemia  Home meds:  Pravachol 40  LDL 85, goal < 70  Resume home statin and continue  statin at discharge  Diabetes type II  HgbA1c 6.2, goal < 7.0  Controlled  Other Stroke Risk Factors  Obesity, Body mass index is 33.73 kg/m., recommend weight loss, diet and exercise as appropriate   Family hx stroke (mother in her 74s)   DISCHARGE EXAM Blood pressure (!) 131/93, pulse 94, temperature 98.5 F (36.9 C), temperature source Oral, resp. rate 20, height 5\' 6"  (1.676 m), weight 94.8 kg, SpO2 96 %.  Discharge Diet   Carb modified thin liquids  DISCHARGE PLAN  Disposition:  Return home  aspirin 81 mg daily for stroke prevention.  Ongoing risk factor control by Primary Care Physician at time of discharge  Follow-up  primary care provider in 2 weeks.  Follow-up in [facility] in 4 weeks, office to schedule an appointment.   30 minutes were spent preparing discharge.  Annie Main, MSN, APRN, ANVP-BC, AGPCNP-BC Advanced Practice Stroke Nurse [facility] See Amion for Schedule & Pager information 09/30/2018 4:38 PM    42 year old patient with diabetes and hyperlipidemia with left-sided weakness.  It was in the setting of a headache but patient denies migraines and his headache has resolved and he still reports left-sided weakness.  Discussed his vascular risk factors and daily aspirin follow-up outpatient.  Personally examined patient and images, and have participated in and made any corrections needed to history, physical, neuro exam,assessment and plan as stated above.  I have personally obtained the history, evaluated lab date, reviewed imaging studies  and agree with radiology interpretations.    Naomie Dean, MD Stroke Neurology   A total of 25 minutes was spent for the care of this patient, spent on counseling patient and family on different diagnostic and therapeutic options, counseling and coordination of care, riskd ans benefits of management, compliance, or risk factor reduction and education.

## 2018-09-30 NOTE — Progress Notes (Signed)
*  PRELIMINARY RESULTS* Echocardiogram 2D Echocardiogram has been performed with Definity.  Alexander Mccormick [name] 09/30/2018, 12:02 PM

## 2018-10-04 ENCOUNTER — Telehealth: Payer: Self-pay | Admitting: Adult Health

## 2018-10-04 NOTE — Telephone Encounter (Signed)
error 

## 2018-10-07 ENCOUNTER — Encounter: Payer: Self-pay | Admitting: Adult Health

## 2018-10-07 ENCOUNTER — Ambulatory Visit: Payer: BLUE CROSS/BLUE SHIELD | Admitting: Adult Health

## 2018-10-07 VITALS — BP 126/87 | HR 86 | Ht 66.0 in | Wt 205.0 lb

## 2018-10-07 DIAGNOSIS — I639 Cerebral infarction, unspecified: Secondary | ICD-10-CM | POA: Diagnosis not present

## 2018-10-07 DIAGNOSIS — R2990 Unspecified symptoms and signs involving the nervous system: Secondary | ICD-10-CM

## 2018-10-07 DIAGNOSIS — E785 Hyperlipidemia, unspecified: Secondary | ICD-10-CM | POA: Diagnosis not present

## 2018-10-07 DIAGNOSIS — Z794 Long term (current) use of insulin: Secondary | ICD-10-CM

## 2018-10-07 DIAGNOSIS — R29818 Other symptoms and signs involving the nervous system: Secondary | ICD-10-CM

## 2018-10-07 DIAGNOSIS — E119 Type 2 diabetes mellitus without complications: Secondary | ICD-10-CM

## 2018-10-07 NOTE — Patient Instructions (Signed)
Continue aspirin 81 mg daily  and pravastatin  for secondary stroke prevention  Continue to follow up with PCP regarding cholesterol, blood pressure and diabetes management   Start therapy sessions on 1/14 for continued deficits  We will repeat MRI due to left sided weakness  Continue to monitor blood pressure at home  Maintain strict control of hypertension with blood pressure goal below 130/90, diabetes with hemoglobin A1c goal below 6.5% and cholesterol with LDL cholesterol (bad cholesterol) goal below 70 mg/dL. I also advised the patient to eat a healthy diet with plenty of whole grains, cereals, fruits and vegetables, exercise regularly and maintain ideal body weight.  Followup in the future with me in 3 months or call earlier if needed       [facility]. I hope we have been able to provide you high quality care today.  You may receive a patient satisfaction survey over the next few weeks. We would appreciate your feedback and comments so that we may continue to improve ourselves and the health of our patients.

## 2018-10-07 NOTE — Progress Notes (Addendum)
[facility] [address] [place].  [id number] [phone]       OFFICE FOLLOW UP NOTE  [name] Date of Birth:  [birth year] Medical Record Number:  [id number]   Reason for Referral:  hospital strokelike symptoms follow up  CHIEF COMPLAINT:  Chief Complaint  Patient presents with  . Follow-up    Follow up for hospital stroke follow up room in back hallway pt with wife Alexander Mccormick his wife pt needs somebody to fill out short term disability. Pt is short of breath when walking far, therapy wll not start till next month    HPI: Alexander Mccormick is being seen today for initial visit in the office for strokelike symptoms s/p tPA on 09/29/2018. History obtained from patient, wife and chart review. Reviewed all radiology images and labs personally.  Alexander Mccormick a 42 y.o.malepast medical history of diabetes, who presented to [facility] emergency [facility] for sudden onset of left-sided tingling, left upper and lower extremity heaviness.  Per [facility] discharge summary notes by Dr. Celene KrasAhern,he reported he has been having some left-sided tingling and numbness for the past 3 weeks but experienced sudden worsening which prompted him for further evaluation in the ED. He also had per report a metallic taste in his mouth at the time. He also complained of headache that at the emergency room [facility] 6 out of 10.  Denies history of migraines or headaches.  Due to acute neurological change, patient was evaluated by telemetry neurology and TPA administered.  [facility] for post TPA care, further evaluation and management.  CTA head reviewed and negative for acute abnormality.  CTA head and neck negative.  MRI brain reviewed and was negative for acute infarct.  2D echo showed an EF of 50 to 55% without cardiac source of embolus.  LDL 85 and A1c 6.2.  Patient was not on antithrombotic PTA and recommended initiating aspirin 81 mg for secondary stroke  prevention.  Recommended continuation of pravastatin 40 mg for HLD management along with continued follow-up by PCP for DM management.  Therapies recommended outpatient PT/OT and was discharged in stable condition.  Patient is being seen today for hospital follow-up and is accompanied by his wife.  He continues to experience left hemiparesis which he states has been present since hospitalization.  He also states on 10/03/2018, he started to experience occipital headache, metallic taste in his mouth with left arm numbness.  He took Tylenol for the headache and went to bed and woke up the next morning without resolution of symptoms.  On 10/05/2018, patient again experienced same type of occipital headache with left arm numbness and left leg numbness therefore he was evaluated at [facility].  He received a migraine cocktail which aborted headache along with left-sided symptoms.  He denies additional migraine/headache since this time.  He denies any personal or family history of migraine/headaches.  He has also been experiencing shortness of breath with exertion along with lightheadedness and dizziness sensation.  He was experiencing this on 09/27/2018 when he was [facility] and per wife, he was told as though his shortness of breath was most likely related to anxiety as all testing came back negative.  He did appear to be extremely short of breath at today's appointment making it difficult for him to speak, have a conversation or take a deep breath for long assessment.  Wife states that this is his typical shortness of breath that he has been  experiencing and she also believes that is most likely due to anxiety as she believes he is overthinking use of his left leg.  He is able to ambulate without assistive device but after ambulating to the bathroom and back during appointment, he was unable to barely move his left leg at all against gravity.  He denies any pain on his left side.  Denies any back  or neck pain.  Denies any other neurological symptom.  He continues to take aspirin 81 mg without side effects of bleeding or bruising.  Continues to take pravastatin without side effects myalgias.  Blood pressure today satisfactory 126/81.  O2 sat was checked while he was ambulating with shortness of breath and maintained at 99%.  No further concerns at this time.     ROS:   14 system review of systems performed and negative with exception of weight loss, fatigue, shortness of breath, diarrhea, feeling cold, headache, weakness, dizziness, decreased energy and change in appetite  PMH:  Past Medical History:  Diagnosis Date  . Diabetes mellitus without complication (HCC)   . Stroke [facility])     PSH: History reviewed. No pertinent surgical history.  Social History:  Social History   Socioeconomic History  . Marital status: Married    Spouse name: Not on file  . Number of children: Not on file  . Years of education: Not on file  . Highest education level: Not on file  Occupational History  . Not on file  Social Needs  . Financial resource strain: Not on file  . Food insecurity:    Worry: Not on file    Inability: Not on file  . Transportation needs:    Medical: Not on file    Non-medical: Not on file  Tobacco Use  . Smoking status: Never Smoker  . Smokeless tobacco: Never Used  Substance and Sexual Activity  . Alcohol use: Not Currently  . Drug use: Not Currently  . Sexual activity: Never  Lifestyle  . Physical activity:    Days per week: Not on file    Minutes per session: Not on file  . Stress: Not on file  Relationships  . Social connections:    Talks on phone: Not on file    Gets together: Not on file    Attends religious service: Not on file    Active member of club or organization: Not on file    Attends meetings of clubs or organizations: Not on file    Relationship status: Not on file  . Intimate partner violence:    Fear of current or ex partner: Not on file      Emotionally abused: Not on file    Physically abused: Not on file    Forced sexual activity: Not on file  Other Topics Concern  . Not on file  Social History Narrative  . Not on file    Family History:  Family History  Problem Relation Age of Onset  . Stroke Mother   . Stroke Paternal Grandmother     Medications:   Current Outpatient Medications on File Prior to Visit  Medication Sig Dispense Refill  . aspirin EC 81 MG EC tablet Take 1 tablet (81 mg total) by mouth daily.    . cholecalciferol (VITAMIN D) 25 MCG (1000 UT) tablet Take 1,000 Units by mouth daily.    . Dapagliflozin-metFORMIN HCl ER (XIGDUO XR) 02-999 MG TB24 Take 1 tablet by mouth daily.    . pioglitazone (ACTOS) 45 MG  tablet Take 45 mg by mouth daily.    . pravastatin (PRAVACHOL) 40 MG tablet Take 40 mg by mouth daily.     No current facility-administered medications on file prior to visit.     Allergies:   Allergies  Allergen Reactions  . Sulfamethoxazole-Trimethoprim Rash     Physical Exam  Vitals:   10/07/18 1318  BP: 126/87  Pulse: 86  Weight: 205 lb (93 kg)  Height: 5\' 6"  (1.676 m)   Body mass index is 33.09 kg/m. No exam data present  General: well developed, well nourished, pleasant middle-aged Caucasian male, seated, in no evident distress Head: head normocephalic and atraumatic.   Neck: supple with no carotid or supraclavicular bruits Cardiovascular: regular rate and rhythm, no murmurs Respiratory: Clear lung fields throughout Musculoskeletal: no deformity Skin:  no rash/petichiae Vascular:  Normal pulses all extremities  Neurologic Exam Mental Status: Awake and fully alert. Oriented to place and time. Recent and remote memory intact. Attention span, concentration and fund of knowledge appropriate. Mood and affect appropriate.  Cranial Nerves: Fundoscopic exam reveals sharp disc margins. Pupils equal, briskly reactive to light. Extraocular movements full without nystagmus. Visual  fields full to confrontation. Hearing intact. Facial sensation intact. Face, tongue, palate moves normally and symmetrically.  Motor: Normal bulk and tone.  Difficulty examining left-sided strength due to giveaway weakness and poor effort by patient.  Patient had to stop halfway through motor testing due to worsening of SOB.  Full strength in right upper and lower extremity. Sensory.:  Decreased sensation left lower extremity Coordination: Patient initially found to have mild to moderate left hand grip weakness but when asked to tap pointer finger and thumb together on left hand, patient unable to touch thumb and finger together Gait and Station: Arises from chair without difficulty. Stance is normal. Gait demonstrates hemiplegic gait but was able to ambulate without assistive device.  Reflexes: 1+ and symmetric. Toes downgoing.    NIHSS  5 (LUE drift, LLE no effort against gravity, partial sensory loss) Modified Rankin  2    Diagnostic Data (Labs, Imaging, Testing)  CT HEAD WO CONTRAST CT ANGIO HEAD W OR WO CONTRAST CT ANGIO NECK W OR WO CONTRAST 09/29/2018 IMPRESSION: 1. Normal CT HEAD with and without contrast. 2. Normal CTA NECK. 3. Normal CTA HEAD.  MR BRAIN WO CONTRAST 09/30/2018 IMPRESSION: Normal brain MRI.  ECHOCARDIOGRAM 09/30/2018 Study Conclusions  - Left ventricle: Infero basal hypokinesis The cavity size was   normal. There was mild focal basal hypertrophy of the septum.   Systolic function was normal. The estimated ejection fraction was   in the range of 50% to 55%. Wall motion was normal; there were no   regional wall motion abnormalities. Left ventricular diastolic   function parameters were normal. - Atrial septum: No defect or patent foramen ovale was identified.    ASSESSMENT: Alexander JourneyJamie Olmo is a 42 y.o. year old male here with strokelike symptoms s/p tPA on 09/29/2018 secondary to likely small vessel disease due to vascular risk factors. Vascular risk  factors include HTN, HLD and DM.  Patient is being seen today for hospital follow-up with continued left hemiparesis (unable to determine exact amount of weakness as described above) and shortness of breath with exertion.    PLAN:  1. Strokelike symptoms: Continue aspirin 81 mg daily  and pravastatin for secondary stroke prevention. Maintain strict control of hypertension with blood pressure goal below 130/90, diabetes with hemoglobin A1c goal below 6.5% and cholesterol with LDL cholesterol (bad  cholesterol) goal below 70 mg/dL.  I also advised the patient to eat a healthy diet with plenty of whole grains, cereals, fruits and vegetables, exercise regularly with at least 30 minutes of continuous activity daily and maintain ideal body weight. 2. Dyspnea with exertion: Increased heart rate during ambulation but otherwise all other vitals stable.  Per wife, extensive cardiac work-up during hospitalization at [facility] and was determined as though this was likely due to anxiety.  Advised him to schedule appointment with PCP but if this worsens, to be further evaluated in the ED 3. Left hemiparesis: He was found to have left hemiparesis during hospitalization but per their assessment, also had giveaway weakness and unable to determine adequate amount of weakness.  Due to ongoing left hemiparesis that has possibly worsened since discharge, we will repeat MRI brain to rule out acute infarct.  It was recommended for him to participate in PT/OT which is scheduled at neuro rehab on 10/22/2018.  He was also advised to continue to stay active at home as tolerated due to dyspnea with exertion.  4. Migraines: Advised patient that if he continues to have migraines with neurological symptoms, we can consider initiating migraine prophylaxis at that time.  We will hold off for now to see if anxiety treatment can help decrease these episodes from occurring which patient and wife are in agreement to. 5. HTN: Advised to  continue current treatment regimen.  Today's BP 126/81.  Advised to continue to monitor at home along with continued follow-up with PCP for management. 6. HLD: Advised to continue current treatment regimen along with continued follow-up with PCP for future prescribing and monitoring of lipid panel 7. DMII: Advised to continue to monitor glucose levels at home along with continued follow-up with PCP for management and monitoring    Follow up in 3 months or call earlier if needed   Greater than 50% of time during this 25 minute visit was spent on counseling, explanation of diagnosis of strokelike symptoms, reviewing risk factor management of HLD, HTN and DM, planning of further management along with potential future management, and discussion with patient and family answering all questions.    George Hugh, AGNP-BC  [facility] [address] [place], [place] [address]  Phone [phone] Fax [phone] Note: This document was prepared with digital dictation and possible smart phrase technology. Any transcriptional errors that result from this process are unintentional.

## 2018-10-13 NOTE — Progress Notes (Signed)
I agree with the above plan 

## 2018-10-14 ENCOUNTER — Telehealth: Payer: Self-pay | Admitting: Adult Health

## 2018-10-14 NOTE — Telephone Encounter (Signed)
Noted! Thank you

## 2018-10-14 NOTE — Telephone Encounter (Signed)
[place] Auth: [id number] (exp. 10/14/18 to 12/13/18).  Just an FYI I spoke to the patient to schedule his MRI. He informed me that he wants to hold off right now because he saw his family Doctor and she wanted to order some other test first.. I informed him if he wanted to have his MRI through [place] to give [place] a call and we can get him on the schedule.

## 2018-10-21 ENCOUNTER — Telehealth: Payer: Self-pay | Admitting: Student in an Organized Health Care Education/Training Program

## 2018-10-21 NOTE — Telephone Encounter (Signed)
I received a message from medical records that patient is requesting amendment to his medical records as he was never told that he had a TIA when he was admitted in December 2019 after receiving TPA at an outside hospital and then being transferred to [facility].  I talked to him in detail and explained that based on his presentation as well as examination at the time, he did have focal neurological symptoms with a negative MRI that qualify as TIA.  There are other differentials including complex migraine and a nonorganic etiology such as anxiety.  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  I have reached out to the outpatient team at [facility] neurology and given his number to them.  I have also told him that he should discuss this with his outpatient team as he said that his physical therapist has already cleared him for work.  I will be available to answer any questions as needed.  I have also sent a detailed message to the outpatient team-Jessica Allayne Butcher, NP and Dr. Pearlean Brownie via the EMR secure messaging system as well as forwarded them the email securely that I have received from medical records.

## 2018-10-22 ENCOUNTER — Ambulatory Visit: Payer: BLUE CROSS/BLUE SHIELD | Admitting: Occupational Therapy

## 2018-10-22 ENCOUNTER — Ambulatory Visit: Payer: BLUE CROSS/BLUE SHIELD | Admitting: Physical Therapy

## 2018-10-22 ENCOUNTER — Other Ambulatory Visit: Payer: Self-pay | Admitting: Neurology

## 2018-10-22 ENCOUNTER — Telehealth: Payer: Self-pay | Admitting: Neurology

## 2018-10-22 NOTE — Telephone Encounter (Signed)
Test however I called the patient and spoke to him.  He had previously called Dr. Wilford Corner and requested update to the documentation his medical records about the fact that he had not had a stroke.  I reviewed his chart and spoke to the patient.  He had a strokelike episode and received IV TPA and made full neurological recovery.  Patient is neurologically cleared to return back to work without restrictions.

## 2018-10-23 NOTE — Telephone Encounter (Signed)
Please fax Letter to Debby Freiberg at [phone] or [phone]

## 2018-10-23 NOTE — Telephone Encounter (Signed)
PT stated he would like to start work in January 20,2020.

## 2018-10-23 NOTE — Telephone Encounter (Signed)
I call patient to get fax number for his job. I explained that he needed a letter for work, and Dr. Pearlean Brownie release him back to work. Pt stated he did not know the fax number but will have his wife call tomorrow. PT stated can we email it, I explained we can mailed it or fax it.

## 2018-10-23 NOTE — Telephone Encounter (Signed)
Letter fax to Debby Freiberg at [phone] and [phone]. Both faxes confirmed.

## 2018-11-04 ENCOUNTER — Encounter (HOSPITAL_COMMUNITY): Payer: Self-pay | Admitting: Neurology

## 2018-11-12 NOTE — Telephone Encounter (Signed)
Per ED notes and based on his symptoms, he was diagnosed with TIA.  I personally cannot change this diagnosis but if Dr. Pearlean Brownie feels otherwise he will have to further handle this.

## 2018-11-12 NOTE — Telephone Encounter (Signed)
Patient called back and stated his job will not let  him return  back to work. Pt stated his job receive Shanda Bumps NP office notes stating he had a TIA/stroke. He stated they receive Dr.Sethi letter stated he did not have a stroke and it was stroke like symptoms,and he can return to work full time without restrictions. He stated per Pam in human resources, Shanda Bumps NP or Dr.SEthi needs to call Dr.Hudson at his job. Pt stated if one of them can call and state he did not have a stroke or TIA he can return to work full time. Pt gave number for Dr. Wilson Singer at [phone]. Rn stated message will be sent to [facility] NP.

## 2018-11-12 NOTE — Telephone Encounter (Signed)
Hospital discharge summary clearly stated strokelike episode and not  TIA which would be the diagnosis

## 2018-11-12 NOTE — Telephone Encounter (Signed)
Dr.SEthi can you called pts medical Md at his job.See note from [facility] NP.Read all of the notes  Listed below is his name.  Dr. Wilson Singer at [phone].

## 2018-11-12 NOTE — Telephone Encounter (Signed)
Pt called said he was advised by Dr Pearlean BrownieSethi there would not be any documentation in his record that he had a stroke or TIA. Pt is saying the [facility] to [facility] relayed this information without him knowing it and his work is not allowing him to return to work, he is a Naval architecttruck driver. Please call to discuss this at [phone]

## 2018-11-13 NOTE — Telephone Encounter (Signed)
I apologize regarding my confusion with this diagnosis.  I am unsure where the "right brain TIA" came from regarding my notes but after review of hospital notes, he was diagnosed with strokelike symptoms s/p IV TPA.  My note was updated regarding this diagnosis based on Dr. Marlis Edelson opinion and hospital notes.  Katrina, can you or Stanton Kidney please resend my office notes to his employer as he is cleared to return back to work at this time

## 2018-11-13 NOTE — Telephone Encounter (Signed)
I fax office note to  [facility] @ [phone] and [phone]. Both faxes confirmed.

## 2018-11-18 NOTE — Telephone Encounter (Signed)
Pt has called re: NP Shanda Bumps calling his company Dr (Dr Wilson Singer), please call

## 2018-11-19 NOTE — Telephone Encounter (Addendum)
I called Dr.Hudson office and spoke to Amy. He was not available. Amystated she will have Dr. Wilson Singer called back and speak with Dr. Pearlean Brownie in regard to patient diagnosis.

## 2018-11-19 NOTE — Telephone Encounter (Signed)
I called Dr.Hudson again for Dr.SEthi. He was not available. I advised his office to have him call back and speak with Dr. Pearlean Brownie.. Left another message.

## 2018-11-19 NOTE — Telephone Encounter (Signed)
Dr.Sethi tried to call Dr. Wilson Singer, he was unable to get in contact with him. He will call Dr. Wilson Singer later.

## 2018-11-19 NOTE — Telephone Encounter (Signed)
I called myself personally this morning as well and was unable to get him

## 2018-11-20 NOTE — Telephone Encounter (Signed)
I called Dr. Wilson Singer again for Dr. Pearlean Brownie. The office stated Dr. Wilson Singer was out of the office today. They stated he did try to call Dr. Pearlean Brownie but did not get a answer. I explained no one was notified that the was trying to reach Dr..Sethi. I ask is there a time frame that Dr.Hudson is available to speak with Dr. Pearlean Brownie concerning pts return to work. The person just kept saying Dr .Wilson Singer is aware and knows to contact Dr. Pearlean Brownie. I stated the pt is trying to return to work and Dr.Sethi will not be here on Friday. Dr Pearlean Brownie was present when I made this phone call.

## 2018-11-20 NOTE — Telephone Encounter (Signed)
Dr. Pearlean Brownie and his nurse have called Dr .Wilson Singer office four times and left messages. Office notes and letter have already been fax.

## 2018-11-21 NOTE — Telephone Encounter (Signed)
Late entry from 10:30am Dr Wilson SingerHudson from pts job spoke with Dr. Pearlean BrownieSEthi this morning about pts condition and ability to work.

## 2018-12-23 ENCOUNTER — Other Ambulatory Visit: Payer: Self-pay

## 2018-12-23 NOTE — Patient Outreach (Signed)
First attempt to obtain mRs. No answer. Left message for return call.  

## 2018-12-25 ENCOUNTER — Other Ambulatory Visit: Payer: Self-pay

## 2018-12-25 NOTE — Patient Outreach (Signed)
Second attempt to obtain mRs. No answer. Left message for return call.  

## 2019-01-06 ENCOUNTER — Ambulatory Visit: Payer: BLUE CROSS/BLUE SHIELD | Admitting: Adult Health

## 2019-01-10 ENCOUNTER — Other Ambulatory Visit: Payer: Self-pay

## 2019-01-10 NOTE — Patient Outreach (Signed)
3 outreach attempts were completed to obtain mRs. mRs could not be obtained because patient never returned my calls. mRs=7 

## 2019-03-04 ENCOUNTER — Telehealth: Payer: Self-pay

## 2019-03-04 NOTE — Telephone Encounter (Signed)
Spoke with the patient and they have given verbal consent to file their insurance and to do a doxy.me visit. E-mail, mobile number and carrier have been confirmed and sent.  E-mail: [email or web address] Text: [phone] Development worker, international aid)

## 2019-03-10 ENCOUNTER — Encounter: Payer: Self-pay | Admitting: Adult Health

## 2019-03-10 ENCOUNTER — Other Ambulatory Visit: Payer: Self-pay

## 2019-03-10 ENCOUNTER — Ambulatory Visit (INDEPENDENT_AMBULATORY_CARE_PROVIDER_SITE_OTHER): Payer: BLUE CROSS/BLUE SHIELD | Admitting: Adult Health

## 2019-03-10 DIAGNOSIS — I639 Cerebral infarction, unspecified: Secondary | ICD-10-CM

## 2019-03-10 DIAGNOSIS — G43009 Migraine without aura, not intractable, without status migrainosus: Secondary | ICD-10-CM

## 2019-03-10 DIAGNOSIS — Z794 Long term (current) use of insulin: Secondary | ICD-10-CM

## 2019-03-10 DIAGNOSIS — E119 Type 2 diabetes mellitus without complications: Secondary | ICD-10-CM

## 2019-03-10 DIAGNOSIS — R413 Other amnesia: Secondary | ICD-10-CM | POA: Diagnosis not present

## 2019-03-10 DIAGNOSIS — E785 Hyperlipidemia, unspecified: Secondary | ICD-10-CM

## 2019-03-10 DIAGNOSIS — R2990 Unspecified symptoms and signs involving the nervous system: Secondary | ICD-10-CM

## 2019-03-10 DIAGNOSIS — I10 Essential (primary) hypertension: Secondary | ICD-10-CM

## 2019-03-10 MED ORDER — TOPIRAMATE 25 MG PO TABS: 25.0000 mg | ORAL_TABLET | Freq: Every day | ORAL | 3 refills | Status: DC

## 2019-03-10 NOTE — Progress Notes (Signed)
[facility] [address] [place]. [place] [address] [phone]       FOLLOW UP NOTE  [name] Date of Birth:  1976-01-05 Medical Record Number:  [id number]   Reason for visit: Strokelike symptoms follow up  Virtual Visit via Video Note  I connected with Alexander Mccormick on 03/10/19 at  7:45 AM EDT by a video enabled telemedicine application located remotely in my own home and verified that I am speaking with the correct person using two identifiers who was located at their own home and accompanied by his wife.   I discussed the limitations of evaluation and management by telemedicine and the availability of in person appointments. The patient expressed understanding and agreed to proceed.   CHIEF COMPLAINT:  Chief Complaint  Patient presents with   Follow-up    Strokelike symptoms follow-up no residual symptoms   Migraine    Ongoing occipital migraine type headaches   Memory Loss    HPI: Alexander Mccormick has been followed in this office regarding strokelike symptoms status post TPA in 09/2018 with office visit scheduled today but due to COVID-19 safety precautions, visit transition to telemedicine via [email or web address] with patient's consent.  Brief hospital summary: Presented to the emergency room at [facility] for sudden onset of left-sided tingling, left upper and lower extremity heaviness.  Per [facility] discharge summary notes by Dr. Celene Mccormick reported he has been having some left-sided tingling and numbness for the past 3 weeks but experienced sudden worsening which prompted him for further evaluation in the ED. He also had per report a metallic taste in his mouth at the time. He also complained of headache that at the emergency room at [facility] 6 out of 10.  Denies history of migraines or headaches.  Due to acute neurological change, patient was evaluated by telemetry neurology and TPA administered.  He was transferred to [facility] for post TPA care,  further evaluation and management.  All imaging negative for acute infarct.  Vessel imaging unremarkable.  2D echo unremarkable.  LDL 85 and A1c 6.2.  No prior history of stroke.  Initiated aspirin 81 mg for secondary stroke prevention. At initial hospital follow-up visit, complains of residual left hemiparesis, occipital headaches and anxiety.   During today's visit, all residual left hemiparesis deficits resolved.  He has returned back to work where he works as a Alexander Mccormick without difficulty.  He has continued to experience occipital headaches which initially decreased in frequency but over the past 2 weeks, he has been experiencing increase headaches.  These headaches can occur 1-2 times per week where he will take Excedrin Migraine with occasional benefit.  Located central occipital area without radiating pain into his neck, arms, back or up into his head.  Denies any other associated symptoms.  Wife also endorses memory loss since his ED visit in 09/2018.  Per wife, he will ask a question or make a statement and then repeat himself shortly after.  No other concerns of memory/cognitive deficits.  He does endorse overall stress with his job but feels as though this has been stable and denies any depression/anxiety.  He continues on aspirin 81 mg and pravastatin without reported side effects.  Recent A1c 6.1.  Does not monitor BP at home.  Continues to follow with PCP for HTN, HLD and DM management.  No further concerns at this time.  Denies new or worsening stroke/TIA symptoms.    ROS:   14 system review of systems performed  and negative with exception of headache and memory loss  PMH:  Past Medical History:  Diagnosis Date   Diabetes mellitus without complication (HCC)    Stroke-like episode (HCC)     PSH: No past surgical history on file.  Social History:  Social History   Socioeconomic History   Marital status: Married    Spouse name: Not on file   Number of children: Not on  file   Years of education: Not on file   Highest education level: Not on file  Occupational History   Not on file  Social Needs   Financial resource strain: Not on file   Food insecurity:    Worry: Not on file    Inability: Not on file   Transportation needs:    Medical: Not on file    Non-medical: Not on file  Tobacco Use   Smoking status: Never Smoker   Smokeless tobacco: Never Used  Substance and Sexual Activity   Alcohol use: Not Currently   Drug use: Not Currently   Sexual activity: Never  Lifestyle   Physical activity:    Days per week: Not on file    Minutes per session: Not on file   Stress: Not on file  Relationships   Social connections:    Talks on phone: Not on file    Gets together: Not on file    Attends religious service: Not on file    Active member of club or organization: Not on file    Attends meetings of clubs or organizations: Not on file    Relationship status: Not on file   Intimate partner violence:    Fear of current or ex partner: Not on file    Emotionally abused: Not on file    Physically abused: Not on file    Forced sexual activity: Not on file  Other Topics Concern   Not on file  Social History Narrative   Not on file    Family History:  Family History  Problem Relation Age of Onset   Stroke Mother    Stroke Paternal Grandmother     Medications:   Current Outpatient Medications on File Prior to Visit  Medication Sig Dispense Refill   aspirin EC 81 MG EC tablet Take 1 tablet (81 mg total) by mouth daily.     cholecalciferol (VITAMIN D) 25 MCG (1000 UT) tablet Take 1,000 Units by mouth daily.     Dapagliflozin-metFORMIN HCl ER (XIGDUO XR) 02-999 MG TB24 Take 1 tablet by mouth daily.     pioglitazone (ACTOS) 45 MG tablet Take 45 mg by mouth daily.     pravastatin (PRAVACHOL) 40 MG tablet Take 40 mg by mouth daily.     No current facility-administered medications on file prior to visit.     Allergies:     Allergies  Allergen Reactions   Sulfamethoxazole-Trimethoprim Rash     Physical Exam  General: well developed, well nourished, pleasant middle-age Caucasian male, seated, in no evident distress Head: head normocephalic and atraumatic.    Neurologic Exam Mental Status: Awake and fully alert. Oriented to place and time. Recent and remote memory intact with subjective occasional short-term memory loss. Attention span, concentration and fund of knowledge appropriate. Mood and affect appropriate.  Cranial Nerves: Extraocular movements full without nystagmus. Hearing intact to voice. Facial sensation intact. Face, tongue, palate moves normally and symmetrically.  Shoulder shrug symmetric. Motor: No evidence of weakness per drift assessment Sensory.: intact to light touch Coordination: Rapid alternating  movements normal in all extremities. Finger-to-nose and heel-to-shin performed accurately bilaterally. Gait and Station: Arises from chair without difficulty. Stance is normal. Gait demonstrates normal stride length and balance .  Reflexes: UTA     Diagnostic Data (Labs, Imaging, Testing)  CT HEAD WO CONTRAST CT ANGIO HEAD W OR WO CONTRAST CT ANGIO NECK W OR WO CONTRAST 09/29/2018 IMPRESSION: 1. Normal CT HEAD with and without contrast. 2. Normal CTA NECK. 3. Normal CTA HEAD.  MR BRAIN WO CONTRAST 09/30/2018 IMPRESSION: Normal brain MRI.  ECHOCARDIOGRAM 09/30/2018 Study Conclusions  - Left ventricle: Infero basal hypokinesis The cavity size was   normal. There was mild focal basal hypertrophy of the septum.   Systolic function was normal. The estimated ejection fraction was   in the range of 50% to 55%. Wall motion was normal; there were no   regional wall motion abnormalities. Left ventricular diastolic   function parameters were normal. - Atrial septum: No defect or patent foramen ovale was identified.    ASSESSMENT: Alexander Mccormick is a 43 y.o. year old male here with  strokelike symptoms s/p tPA on 09/29/2018 secondary to likely small vessel disease due to vascular risk factors. Vascular risk factors include HTN, HLD and DM.  Greatest complaint today are ongoing occipital headaches/migraines and occasional short-term memory loss.  Denies residual left hemiparesis deficits.    PLAN:  1. Strokelike symptoms: Continue aspirin 81 mg daily  and pravastatin for secondary stroke prevention. Maintain strict control of hypertension with blood pressure goal below 130/90, diabetes with hemoglobin A1c goal below 6.5% and cholesterol with LDL cholesterol (bad cholesterol) goal below 70 mg/dL.  I also advised the patient to eat a healthy diet with plenty of whole grains, cereals, fruits and vegetables, exercise regularly with at least 30 minutes of continuous activity daily and maintain ideal body weight. 2. Migraines: initiate topamax  nightly - call after 2 weeks for possible need of dosage increase.  No indication for imaging at this time as headache characteristic similar to headache he was experiencing during 09/2018 admission.  Discussion regarding possible worsening over the past 2 weeks due to increased stress and stress reduction techniques discussed. 3. Memory loss: Likely secondary to stress which was discussed with patient and importance of stress reduction techniques.  Also recommended mind exercises such as crossword puzzles, word search, sudoku, card games and reading 4. HTN: Advised to continue current treatment regimen. Continue follow-up with PCP for ongoing management. 5. HLD: Advised to continue current treatment regimen along with continued follow-up with PCP for future prescribing and monitoring of lipid panel 6. DMII: Continue current treatment regimen and ongoing follow-up with PCP for management    Follow up in 3 months or call earlier if needed   Greater than 50% of time during this 25 minute non-face-to-face visit was spent on counseling,  discussion regarding ongoing migraines and memory loss complaints, reviewing risk factor management of HLD, HTN and DM, planning of further management along with potential future management, and discussion with patient and family answering all questions.    George Hugh, AGNP-BC  [facility] [address] [place], [place] [address]  Phone [phone] Fax [phone] Note: This document was prepared with digital dictation and possible smart phrase technology. Any transcriptional errors that result from this process are unintentional.

## 2019-03-10 NOTE — Progress Notes (Signed)
I agree with the above plan 

## 2019-05-30 ENCOUNTER — Other Ambulatory Visit: Payer: Self-pay | Admitting: Adult Health

## 2019-06-04 ENCOUNTER — Telehealth: Payer: Self-pay | Admitting: Adult Health

## 2019-06-04 NOTE — Telephone Encounter (Signed)
I called this patient regarding rescheduling or converting his 8/31 appt to a virtual visit via mychart. Patient states that he actually would like to cancel this appointment as he no longer feels he needs it, and has stopped taking the medication prescribed to him. I advised patient to call back if needed.

## 2019-06-09 ENCOUNTER — Ambulatory Visit: Payer: BLUE CROSS/BLUE SHIELD | Admitting: Adult Health

## 2021-08-10 ENCOUNTER — Other Ambulatory Visit: Payer: Self-pay

## 2021-08-10 ENCOUNTER — Emergency Department (HOSPITAL_COMMUNITY)
Admission: EM | Admit: 2021-08-10 | Discharge: 2021-08-11 | Disposition: A | Discharge status: Home / Self Care | Payer: BC Managed Care – PPO | Attending: Student | Admitting: Student

## 2021-08-10 ENCOUNTER — Emergency Department (HOSPITAL_COMMUNITY): Payer: BC Managed Care – PPO

## 2021-08-10 ENCOUNTER — Encounter (HOSPITAL_COMMUNITY): Payer: Self-pay | Admitting: Emergency Medicine

## 2021-08-10 DIAGNOSIS — Z20822 Contact with and (suspected) exposure to covid-19: Secondary | ICD-10-CM | POA: Diagnosis not present

## 2021-08-10 DIAGNOSIS — Z7984 Long term (current) use of oral hypoglycemic drugs: Secondary | ICD-10-CM | POA: Diagnosis not present

## 2021-08-10 DIAGNOSIS — R42 Dizziness and giddiness: Secondary | ICD-10-CM | POA: Insufficient documentation

## 2021-08-10 DIAGNOSIS — R531 Weakness: Secondary | ICD-10-CM | POA: Insufficient documentation

## 2021-08-10 DIAGNOSIS — R519 Headache, unspecified: Secondary | ICD-10-CM | POA: Insufficient documentation

## 2021-08-10 DIAGNOSIS — R262 Difficulty in walking, not elsewhere classified: Secondary | ICD-10-CM | POA: Insufficient documentation

## 2021-08-10 DIAGNOSIS — Z79899 Other long term (current) drug therapy: Secondary | ICD-10-CM | POA: Insufficient documentation

## 2021-08-10 DIAGNOSIS — E119 Type 2 diabetes mellitus without complications: Secondary | ICD-10-CM | POA: Insufficient documentation

## 2021-08-10 DIAGNOSIS — Z7982 Long term (current) use of aspirin: Secondary | ICD-10-CM | POA: Insufficient documentation

## 2021-08-10 DIAGNOSIS — R0789 Other chest pain: Secondary | ICD-10-CM | POA: Diagnosis not present

## 2021-08-10 LAB — I-STAT CHEM 8, ED
BUN: 12 mg/dL (ref 6–20)
Calcium, Ion: 1.03 mmol/L — ABNORMAL LOW (ref 1.15–1.40)
Chloride: 102 mmol/L (ref 98–111)
Creatinine, Ser: 1.1 mg/dL (ref 0.61–1.24)
Glucose, Bld: 191 mg/dL — ABNORMAL HIGH (ref 70–99)
HCT: 43 % (ref 39.0–52.0)
Hemoglobin: 14.6 g/dL (ref 13.0–17.0)
Potassium: 3.6 mmol/L (ref 3.5–5.1)
Sodium: 138 mmol/L (ref 135–145)
TCO2: 24 mmol/L (ref 22–32)

## 2021-08-10 LAB — COMPREHENSIVE METABOLIC PANEL
ALT: 31 U/L (ref 0–44)
AST: 27 U/L (ref 15–41)
Albumin: 3.5 g/dL (ref 3.5–5.0)
Alkaline Phosphatase: 71 U/L (ref 38–126)
Anion gap: 9 (ref 5–15)
BUN: 12 mg/dL (ref 6–20)
CO2: 24 mmol/L (ref 22–32)
Calcium: 9 mg/dL (ref 8.9–10.3)
Chloride: 103 mmol/L (ref 98–111)
Creatinine, Ser: 1.15 mg/dL (ref 0.61–1.24)
GFR, Estimated: >60 mL/min (ref >60)
Glucose, Bld: 189 mg/dL — ABNORMAL HIGH (ref 70–99)
Potassium: 3.7 mmol/L (ref 3.5–5.1)
Sodium: 136 mmol/L (ref 135–145)
Total Bilirubin: 0.3 mg/dL (ref 0.3–1.2)
Total Protein: 6.4 g/dL — ABNORMAL LOW (ref 6.5–8.1)

## 2021-08-10 LAB — CBG MONITORING, ED: Glucose-Capillary: 193 mg/dL — ABNORMAL HIGH (ref 70–99)

## 2021-08-10 LAB — CBC
HCT: 43.9 % (ref 39.0–52.0)
Hemoglobin: 15 g/dL (ref 13.0–17.0)
MCH: 31.5 pg (ref 26.0–34.0)
MCHC: 34.2 g/dL (ref 30.0–36.0)
MCV: 92.2 fL (ref 80.0–100.0)
Platelets: 351 10*3/uL (ref 150–400)
RBC: 4.76 MIL/uL (ref 4.22–5.81)
RDW: 12.5 % (ref 11.5–15.5)
WBC: 7.4 10*3/uL (ref 4.0–10.5)
nRBC: 0 % (ref 0.0–0.2)

## 2021-08-10 LAB — DIFFERENTIAL
Abs Immature Granulocytes: 0.02 10*3/uL (ref 0.00–0.07)
Basophils Absolute: 0 10*3/uL (ref 0.0–0.1)
Basophils Relative: 0 %
Eosinophils Absolute: 0.1 10*3/uL (ref 0.0–0.5)
Eosinophils Relative: 1 %
Immature Granulocytes: 0 %
Lymphocytes Relative: 33 %
Lymphs Abs: 2.4 10*3/uL (ref 0.7–4.0)
Monocytes Absolute: 0.7 10*3/uL (ref 0.1–1.0)
Monocytes Relative: 9 %
Neutro Abs: 4.2 10*3/uL (ref 1.7–7.7)
Neutrophils Relative %: 57 %

## 2021-08-10 LAB — PROTIME-INR
INR: 1 (ref 0.8–1.2)
Prothrombin Time: 13.5 seconds (ref 11.4–15.2)

## 2021-08-10 LAB — RESP PANEL BY RT-PCR (FLU A&B, COVID) ARPGX2
Influenza A by PCR: NEGATIVE
Influenza B by PCR: NEGATIVE
SARS Coronavirus 2 by RT PCR: NEGATIVE

## 2021-08-10 LAB — APTT: aPTT: 26 seconds (ref 24–36)

## 2021-08-10 MED ORDER — LACTATED RINGERS IV BOLUS
1000.0000 mL | Freq: Once | INTRAVENOUS | Status: AC
  Administered 2021-08-10: 1000 mL via INTRAVENOUS

## 2021-08-10 MED ORDER — SODIUM CHLORIDE 0.9% FLUSH
3.0000 mL | Freq: Once | INTRAVENOUS | Status: AC
Start: 2021-08-10 — End: 2021-08-10
  Administered 2021-08-10: 3 mL via INTRAVENOUS

## 2021-08-10 MED ORDER — KETOROLAC TROMETHAMINE 15 MG/ML IJ SOLN
15.0000 mg | Freq: Once | INTRAMUSCULAR | Status: AC
  Administered 2021-08-10: 15 mg via INTRAVENOUS
  Filled 2021-08-10: qty 1

## 2021-08-10 MED ORDER — DIPHENHYDRAMINE HCL 50 MG/ML IJ SOLN
25.0000 mg | Freq: Once | INTRAMUSCULAR | Status: AC
  Administered 2021-08-10: 25 mg via INTRAVENOUS
  Filled 2021-08-10: qty 1

## 2021-08-10 MED ORDER — PROCHLORPERAZINE EDISYLATE 10 MG/2ML IJ SOLN
10.0000 mg | Freq: Once | INTRAMUSCULAR | Status: AC
  Administered 2021-08-10: 10 mg via INTRAVENOUS
  Filled 2021-08-10: qty 2

## 2021-08-10 NOTE — ED Notes (Signed)
Pt alert and oriented, NIH score is a 0.  Pt displaying no neuro symptoms other than a headache.

## 2021-08-10 NOTE — ED Triage Notes (Signed)
Per [facility], pt took a nap at 12:30pm feeling just fine.  When he woke up at 5:30pm he had he was dizzy (room spinning) and had a headache.  Reports prior stroke in 2019 w/ headache as well.    C/O Chest pain w/ EMS, given 324 ASA and 2 nitro which dropped his BP from 160/100 to 148/80.   CBG 193 HR 120 ST 100% RA 18G R AC

## 2021-08-10 NOTE — ED Notes (Signed)
Pt family praying w/ family, delayed given medication.

## 2021-08-10 NOTE — ED Notes (Signed)
Provider bedside, wife also bedside now.

## 2021-08-10 NOTE — ED Notes (Addendum)
Pt was able to stand for othrostatic vitals w/o any issues of dizziness.  He reports all symptoms have resolved and he "feels great."  No more headache.  Neuro provider aware and is cancelling the MRI.

## 2021-08-10 NOTE — Code Documentation (Addendum)
Responded to Code Stroke called at 2007 for L sided weakness/headache/dizziness, [id number]. Pt arrived at 2032, CBG-193, NIH-0, CT head negative for acute changes. TNK not given-out of window. Plan for MRI.

## 2021-08-10 NOTE — ED Notes (Signed)
Pt stood to urinate w/ RN standby assist.  Heart rate increased to 120 but slowly decreased to 101.  Will continue to monitor.

## 2021-08-10 NOTE — ED Provider Notes (Signed)
[facility] EMERGENCY DEPARTMENT Provider Note   CSN: [id number] Arrival date & time: 08/10/21  2032  An emergency department physician performed an initial assessment on this suspected stroke patient at 2037.  History Chief Complaint  Patient presents with   Code Stroke    Alexander Mccormick is a 45 y.o. male who presents to the emergency department as a stroke alert for lower extremity weakness, persistent dizziness.  Patient states that he took a nap at 12:30 PM and this is his last known well and awoke at 5:30 PM with vertigo, intense headache and inability to walk.  He endorses significant ataxia with his severe headache at that time.  EMS was called and the patient developed chest pain and pressure.  He was given 2 nitroglycerin tabs and his chest pain and pressure has completely resolved.  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 No pertinent surgical history.     Family History  Problem Relation Age of Onset   Stroke Mother    Stroke Paternal Grandmother     Social History   Tobacco Use   Smoking status: Never   Smokeless tobacco: Never  Substance Use Topics   Alcohol use: Not Currently   Drug use: Not Currently    Home Medications Prior to Admission medications   Medication Sig Start Date End Date Taking? Authorizing Provider  aspirin EC 81 MG EC tablet Take 1 tablet (81 mg total) by mouth daily. 09/30/18   Layne Benton, NP  cholecalciferol (VITAMIN D) 25 MCG (1000 UT) tablet Take 1,000 Units by mouth daily.    [provider]  Dapagliflozin-metFORMIN HCl ER (XIGDUO XR) 02-999 MG TB24 Take 1 tablet by mouth daily. 09/13/18   [provider]   pioglitazone (ACTOS) 45 MG tablet Take 45 mg by mouth daily. 09/23/18   [provider]  pravastatin (PRAVACHOL) 40 MG tablet Take 40 mg by mouth daily. 09/06/18   [provider]  topiramate (TOPAMAX) 25 MG tablet TAKE 1 TABLET BY MOUTH EVERYDAY AT BEDTIME 06/02/19   Ihor Austin, NP    Allergies    Sulfamethoxazole-trimethoprim  Review of Systems   Review of Systems  Constitutional:  Negative for chills and fever.  HENT:  Negative for ear pain and sore throat.   Eyes:  Negative for pain and visual disturbance.  Respiratory:  Negative for cough and shortness of breath.   Cardiovascular:  Negative for chest pain and palpitations.  Gastrointestinal:  Negative for abdominal pain and vomiting.  Genitourinary:  Negative for dysuria and hematuria.  Musculoskeletal:  Negative for arthralgias and back pain.  Skin:  Negative for color change and rash.  Neurological:  Positive for dizziness, weakness and headaches. Negative for seizures and syncope.  All other systems reviewed and are negative.  Physical Exam Updated Vital Signs BP (!) 152/94   Pulse (!) 117   Temp 98.6 F (37 C) (Temporal)   Resp 18   Ht 5\' 6"  (1.676 m)   Wt 110 kg   SpO2 97%   BMI 39.14 kg/m   Physical Exam Vitals and nursing note reviewed.  Constitutional:      Appearance: He  is well-developed.  HENT:     Head: Normocephalic and atraumatic.  Eyes:     Conjunctiva/sclera: Conjunctivae normal.  Cardiovascular:     Rate and Rhythm: Normal rate and regular rhythm.     Heart sounds: No murmur heard. Pulmonary:     Effort: Pulmonary effort is normal. No respiratory distress.     Breath sounds: Normal breath sounds.  Abdominal:     Palpations: Abdomen is soft.     Tenderness: There is no abdominal tenderness.  Musculoskeletal:     Cervical back: Neck supple.  Skin:    General: Skin is warm and dry.  Neurological:     General: No focal deficit present.     Mental Status: He is alert.      Cranial Nerves: No cranial nerve deficit.     Sensory: No sensory deficit.     Motor: No weakness.    ED Results / Procedures / Treatments   Labs (all labs ordered are listed, but only abnormal results are displayed) Labs Reviewed  I-STAT CHEM 8, ED - Abnormal; Notable for the following components:      Result Value   Glucose, Bld 191 (*)    Calcium, Ion 1.03 (*)    All other components within normal limits  CBG MONITORING, ED - Abnormal; Notable for the following components:   Glucose-Capillary 193 (*)    All other components within normal limits  RESP PANEL BY RT-PCR (FLU A&B, COVID) ARPGX2  PROTIME-INR  APTT  CBC  DIFFERENTIAL  COMPREHENSIVE METABOLIC PANEL  TROPONIN I (HIGH SENSITIVITY)    EKG None  Radiology CT HEAD CODE STROKE WO CONTRAST  Result Date: 08/10/2021 CLINICAL DATA:  Code stroke.  Acute neuro deficit. EXAM: CT HEAD WITHOUT CONTRAST TECHNIQUE: Contiguous axial images were obtained from the base of the skull through the vertex without intravenous contrast. COMPARISON:  CT head 10/04/2018 FINDINGS: Brain: No evidence of acute infarction, hemorrhage, hydrocephalus, extra-axial collection or mass lesion/mass effect. Vascular: Negative for hyperdense vessel Skull: 7 mm lytic lesion right frontal bone unchanged from 2019. No other skull lesion identified Sinuses/Orbits: Mild mucosal edema bilaterally.  Negative orbit Other: None ASPECTS ([place] Stroke Program Early CT Score) - Ganglionic level infarction (caudate, lentiform nuclei, internal capsule, insula, M1-M3 cortex): 7 - Supraganglionic infarction (M4-M6 cortex): 3 Total score (0-10 with 10 being normal): 10 IMPRESSION: 1. Negative CT of the brain.  No acute abnormality. 2. ASPECTS is 10 3. Code stroke imaging results were communicated on 08/10/2021 at 8:47 pm to provider Leonel Ramsay via text page Electronically Signed   By: Franchot Gallo M.D.   On: 08/10/2021 20:49    Procedures Procedures   Medications  Ordered in ED Medications  prochlorperazine (COMPAZINE) injection 10 mg (has no administration in time range)  diphenhydrAMINE (BENADRYL) injection 25 mg (has no administration in time range)  lactated ringers bolus 1,000 mL (has no administration in time range)  ketorolac (TORADOL) 15 MG/ML injection 15 mg (has no administration in time range)  sodium chloride flush (NS) 0.9 % injection 3 mL (3 mLs Intravenous Given 08/10/21 2058)    ED Course  I have reviewed the triage vital signs and the nursing notes.  Pertinent labs & imaging results that were available during my care of the patient were reviewed by me and considered in my medical decision making (see chart for details).  Clinical Course as of 08/10/21 2116  Wed Aug 10, 2021  2046 Per Neuro, Dr. Leonel Ramsay, suspect complicated migraine. If MRI  negative, may manage chest pain per EDP discretion. I appreciate his collaboration in the care of this patient.   [RS]    Clinical Course User Index [RS] Sponseller, Sharlene Dory   MDM Rules/Calculators/A&P                           Patient seen emergency department for evaluation of lower extremity weakness, headache is a stroke alert.  Stroke work-up unremarkable with negative CT head.  Neurology concern for complex migraine.  Laboratory evaluation unremarkable.  Patient given headache cocktail which improved all of his resenting symptoms.  Patient able to ambulate without difficulty here in the emergency department.  Patient then discharged with outpatient neurologic follow-up and need for possible preventative therapy. Final Clinical Impression(s) / ED Diagnoses Final diagnoses:  None    Rx / DC Orders ED Discharge Orders     None        Efosa Treichler, Debe Coder, MD 08/10/21 [id number]

## 2021-08-10 NOTE — Discharge Instructions (Signed)
You are seen the emergency department for evaluation of multiple complaints including headache, leg weakness and chest pain.  A stroke work-up was performed that was reassuringly negative and your symptoms are likely due to a complex migraine.  You received medication for migraine which improved her symptoms dramatically.  Please follow-up with neurology for likely need for preventative therapy for your migraine headaches and at this time you are safe for discharge.  Return the emergency department you have new or worsening headache, fever, persistent vomiting, numbness, tingling, weakness or any other concerning symptoms.

## 2021-08-10 NOTE — Consult Note (Signed)
Neurology Consultation Reason for Consult: Dizziness Referring Physician: Kommor, M  CC: Dizziness  History is obtained from: Patient  HPI: Alexander Mccormick is a 45 y.o. male with a history of previous strokelike episode with negative MRI who presents with dizziness and headache.  He states that he also had attic with his previous episode.  He was in his normal state of health when he laid down for a nap at 12:30 PM.  When he awoke around 5 PM, he was unsteady.  He describes a room spinning sensation.  He then started having some chest pain which resolved with nitro en route.  He states that the symptoms are similar to when he had his previous stroke except for the headache is on the top of his head instead of on the back of his head.   LKW: 12:30pm tpa given?: no, out of window   ROS: A 14 point ROS was performed and is negative except as noted in the HPI.  Past Medical History:  Diagnosis Date   Diabetes mellitus without complication (HCC)    Stroke-like episode      Family History  Problem Relation Age of Onset   Stroke Mother    Stroke Paternal Grandmother      Social History:  reports that he has never smoked. He has never used smokeless tobacco. He reports that he does not currently use alcohol. He reports that he does not currently use drugs.   Exam: Current vital signs: BP (!) 116/93   Pulse (!) 112   Temp 98.6 F (37 C) (Temporal)   Resp (!) 23   Ht 5\' 6"  (1.676 m)   Wt 110 kg   SpO2 98%   BMI 39.14 kg/m  Vital signs in last 24 hours: Temp:  [98.6 F (37 C)] 98.6 F (37 C) (11/02 2054) Pulse Rate:  [94-117] 112 (11/02 2200) Resp:  [18-23] 23 (11/02 2115) BP: (116-152)/(84-94) 116/93 (11/02 2200) SpO2:  [96 %-100 %] 98 % (11/02 2200) Weight:  [110 kg] 110 kg (11/02 2040)   Physical Exam  Constitutional: Appears well-developed and well-nourished.  Psych: Affect appropriate to situation Eyes: No scleral injection HENT: No OP obstruction MSK: no joint  deformities.  Cardiovascular: Normal rate and regular rhythm.  Respiratory: Effort normal, non-labored breathing GI: Soft.  No distension. There is no tenderness.  Skin: WDI  Neuro: Mental Status: Patient is awake, alert, oriented to person, place, month, year, and situation. Patient is able to give a clear and coherent history. No signs of aphasia or neglect Cranial Nerves: II: Visual Fields are full. Pupils are equal, round, and reactive to light.   III,IV, VI: EOMI without ptosis or diploplia.  V: Facial sensation is symmetric to temperature VII: Facial movement is symmetric VIII: hearing is intact to voice X: Uvula elevates symmetrically XI: Shoulder shrug is symmetric. XII: tongue is midline without atrophy or fasciculations.  Motor: Tone is normal. Bulk is normal. 5/5 strength was present in all four extremities.  Sensory: Sensation is symmetric to light touch and temperature in the arms and legs. Cerebellar: FNF and HKS are intact bilaterally   I have reviewed labs in epic and the results pertinent to this consultation are: Creatinine 1.15 Glucose 189  I have reviewed the images obtained: CT head-negative  Impression: 45 year old male with dizziness and in the setting of headache.  My suspicion is that this represents complicated migraine, similar to his previous episode.  He has persistent dizziness, but no findings of cerebellar  dysfunction, EEG nystagmus or ataxia.  I do, however,  think that he needs to have stroke ruled out with MRI.  If MRI is negative, then no further neurological recommendations, and treat chest pressure per ED physician.  Recommendations: 1) MRI brain 2) orthostatics 3) if MRI and orthostatics are negative, would treat as complicated migraine.    Ritta Slot, MD [facility] [phone]  If 7pm- 7am, please page neurology on call as listed in AMION.

## 2021-09-13 ENCOUNTER — Encounter: Payer: Self-pay | Admitting: Psychiatry

## 2021-09-13 ENCOUNTER — Ambulatory Visit: Payer: BC Managed Care – PPO | Admitting: Psychiatry

## 2021-09-13 ENCOUNTER — Telehealth: Payer: Self-pay

## 2021-09-13 VITALS — BP 139/87 | HR 94 | Ht 66.0 in | Wt 237.0 lb

## 2021-09-13 DIAGNOSIS — G43409 Hemiplegic migraine, not intractable, without status migrainosus: Secondary | ICD-10-CM | POA: Diagnosis not present

## 2021-09-13 MED ORDER — NURTEC 75 MG PO TBDP: 75.0000 mg | ORAL_TABLET | ORAL | 2 refills | Status: DC | PRN

## 2021-09-13 NOTE — Telephone Encounter (Signed)
I submitted a PA request for Nurtec on CMM, Key: [id number].   Triptans are contraindicated for hemiplegic migraines.   Nurtec Approved. CaseId:[id number];Status:Approved;Review Type:Prior Auth;Coverage Start Date:08/14/2021;Coverage End Date:09/13/2022

## 2021-09-13 NOTE — Patient Instructions (Signed)
Start Nurtec as needed for migraines.

## 2021-09-13 NOTE — Progress Notes (Signed)
Referring:  Marlyn Corporal, PA [address] [facility] [place] [place],  [place] [address]  PCP: Pcp, No  Neurology was asked to evaluate Alexander Mccormick, a 45 year old male for a chief complaint of headaches.  Our recommendations of care will be communicated by shared medical record.    CC:  headaches  HPI:  Medical co-morbidities: DM2  The patient presents for evaluation of migraines which began in 2020. Currently had headaches every 2-3 days. It is described as a holocephalic aching with associated photophobia, phonophobia, and nausea. Most days the headache is mild, but he will occasionally have severe ones. Takes tylenol as needed which usually resolves the headache.  Recently presented to the ED last month for a severe headache which was associated with left lower extremity weakness, light-headedness, and inability to walk. CTH was negative for acute process. Leg weakness lasted the rest of the day. Headache lasted for ~2-3 hours and resolved with a headache cocktail.  In 2019 he had an episode of headache with left sided numbness and weakness of the left arm and leg. Underwent MRI brain and CTA head/neck which were normal.   Headache History: Onset: 2020 Triggers: none Aura: numbness, weakness Location: holocephalic Quality/Description: aching Associated Symptoms:  Photophobia: yes  Phonophobia: yes  Nausea: no Vomiting: no Worse with activity?: yes Duration of headaches: 1-2 hours with tylenol  Headache days per month: 12 Headache free days per month: 18  Current Treatment: Abortive tylenol  Preventative none  Prior Therapies                                 Tylenol ibuprofen   Headache Risk Factors: Headache risk factors and/or co-morbidities (-) Neck Pain (-) History of Motor Vehicle Accident (-) Obesity  Body mass index is 38.25 kg/m.  LABS: CBC    Component Value Date/Time   WBC 7.4 08/10/2021 2034   RBC 4.76 08/10/2021 2034   HGB 14.6 08/10/2021 2042   HCT  43.0 08/10/2021 2042   PLT 351 08/10/2021 2034   MCV 92.2 08/10/2021 2034   MCH 31.5 08/10/2021 2034   MCHC 34.2 08/10/2021 2034   RDW 12.5 08/10/2021 2034   LYMPHSABS 2.4 08/10/2021 2034   MONOABS 0.7 08/10/2021 2034   EOSABS 0.1 08/10/2021 2034   BASOSABS 0.0 08/10/2021 2034   CMP Latest Ref Rng & Units 08/10/2021 08/10/2021  Glucose 70 - 99 mg/dL 782(N) 562(Z)  BUN 6 - 20 mg/dL 12 12  Creatinine 3.08 - 1.24 mg/dL 6.57 8.46  Sodium 962 - 145 mmol/L 138 136  Potassium 3.5 - 5.1 mmol/L 3.6 3.7  Chloride 98 - 111 mmol/L 102 103  CO2 22 - 32 mmol/L - 24  Calcium 8.9 - 10.3 mg/dL - 9.0  Total Protein 6.5 - 8.1 g/dL - 6.4(L)  Total Bilirubin 0.3 - 1.2 mg/dL - 0.3  Alkaline Phos 38 - 126 U/L - 71  AST 15 - 41 U/L - 27  ALT 0 - 44 U/L - 31     IMAGING:  [facility] 08/10/21: unremarkable MRI brain 09/2018: unremarkable CTA head/neck 09/2018: unremarkable  Imaging independently reviewed on September 13, 2021   Current Outpatient Medications on File Prior to Visit  Medication Sig Dispense Refill   aspirin EC 81 MG EC tablet Take 1 tablet (81 mg total) by mouth daily.     cholecalciferol (VITAMIN D) 25 MCG (1000 UT) tablet Take 1,000 Units by mouth daily.  Dapagliflozin-metFORMIN HCl ER 02-999 MG TB24 Take 1 tablet by mouth daily.     pioglitazone (ACTOS) 45 MG tablet Take 45 mg by mouth daily.     pravastatin (PRAVACHOL) 40 MG tablet Take 40 mg by mouth daily.     No current facility-administered medications on file prior to visit.     Allergies: Allergies  Allergen Reactions   Sulfamethoxazole-Trimethoprim Rash    Family History: Migraine or other headaches in the family:  no Aneurysms in a first degree relative:  no Brain tumors in the family:  no Other neurological illness in the family:   no  Past Medical History: Past Medical History:  Diagnosis Date   Diabetes mellitus without complication (HCC)    Stroke-like episode     Past Surgical History History reviewed.  No pertinent surgical history.  Social History: Social History   Tobacco Use   Smoking status: Never   Smokeless tobacco: Never  Substance Use Topics   Alcohol use: Not Currently   Drug use: Not Currently    ROS: Negative for fevers, chills. Positive for headaches, left extremity paresthesias and weakness. All other systems reviewed and negative unless stated otherwise in HPI.   Physical Exam:   Vital Signs: BP 139/87   Pulse 94   Ht 5\' 6"  (1.676 m)   Wt 237 lb (107.5 kg)   BMI 38.25 kg/m  GENERAL: well appearing,in no acute distress,alert SKIN:  Color, texture, turgor normal. No rashes or lesions HEAD:  Normocephalic/atraumatic. CV:  RRR RESP: Normal respiratory effort MSK: +tenderness to palpation over left neck  NEUROLOGICAL: Mental Status: Alert, oriented to person, place and time,Follows commands Cranial Nerves: PERRL,visual fields intact to confrontation,extraocular movements intact,facial sensation intact,no facial droop or ptosis,hearing intact to finger rub bilaterally,no dysarthria Motor: muscle strength 5/5 both upper and lower extremities,no drift, normal tone Reflexes: 2+ throughout Sensation: intact to light touch all 4 extremities Coordination: Finger-to- nose-finger intact bilaterally Gait: normal-based   IMPRESSION: 45 year old male with a history of DM2 who presents for evaluation of migraines with associated left extremity numbness and weakness. He is back to baseline with a normal neurological exam today. 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 Discussed treatment options including preventive and acute medications. He would prefer to start with an as-needed medication for now. Triptans are contraindicated for hemiplegic migraine. Will start Nurtec for rescue.  PLAN: -Rescue: Start Nurtec 75 mg PRN -next steps: consider daily preventive if migraines are not well managed with rescue medication only   I spent a total of 24  minutes chart reviewing and counseling the patient. Headache education was done. Discussed treatment options including preventive and acute medications. Discussed medication side effects, adverse reactions and drug interactions. Written educational materials and patient instructions outlining all of the above were given.  Follow-up: 3 months   54, MD 09/13/2021   12:01 PM

## 2021-12-26 ENCOUNTER — Ambulatory Visit: Payer: BC Managed Care – PPO | Admitting: Psychiatry

## 2022-04-17 ENCOUNTER — Ambulatory Visit: Payer: BC Managed Care – PPO | Admitting: Psychiatry

## 2023-07-30 IMAGING — CT CT HEAD CODE STROKE
4 series · 17 of 47 positions shown, 19 images · non-contrast
Comparison: CT head 10/04/2018

CLINICAL DATA: Code stroke.  Acute neuro deficit.

EXAM:
CT HEAD WITHOUT CONTRAST
TECHNIQUE: Contiguous axial images were obtained from the base of the skull
through the vertex without intravenous contrast.

[Series 3: head wo · axial · 0.41mm/px · z∈[-160,-44]mm · 7 of 31 slices shown, 9 images]
[im 4/31  brain]
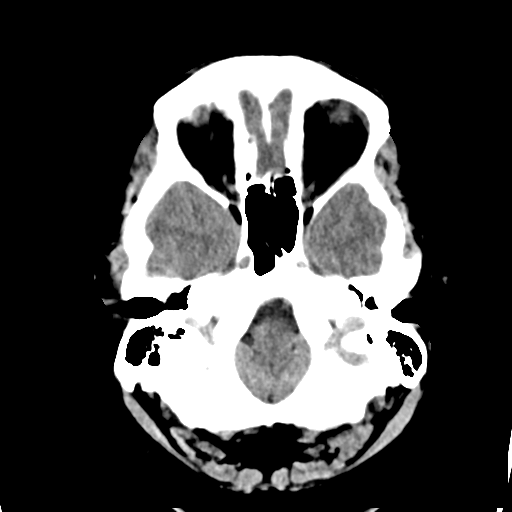
[im 4/31  bone]
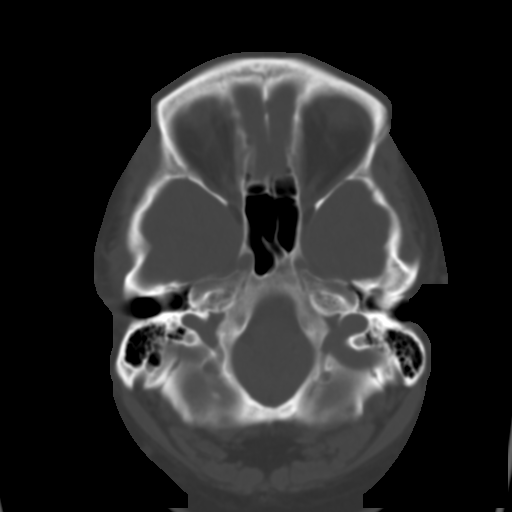
[im 8/31  brain]
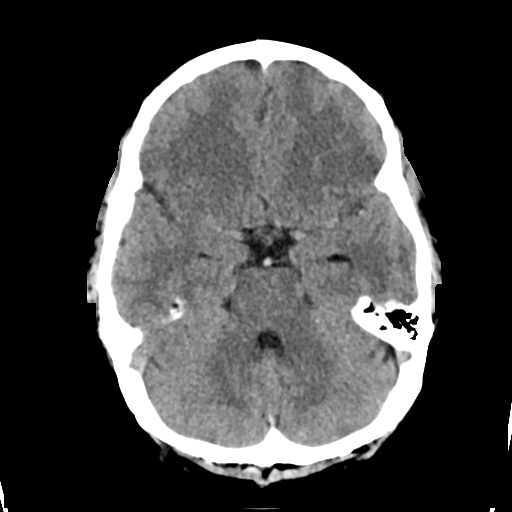
[im 12/31  brain]
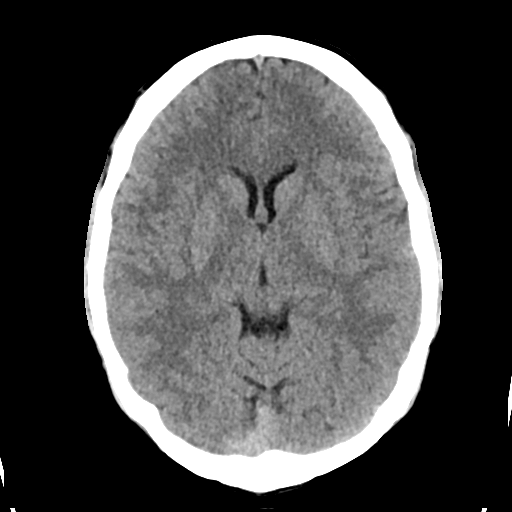
[im 16/31  brain]
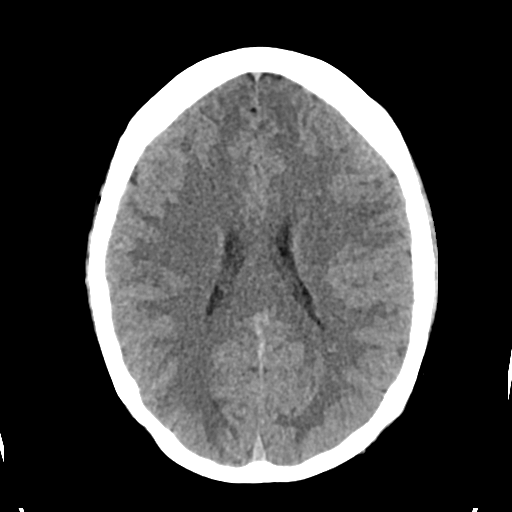
[im 19/31  brain]
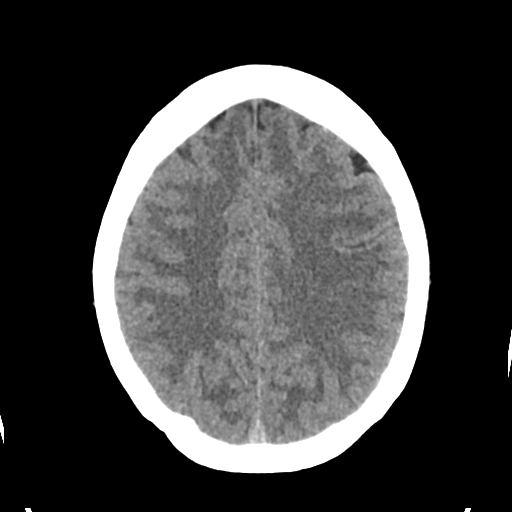
[im 19/31  bone]
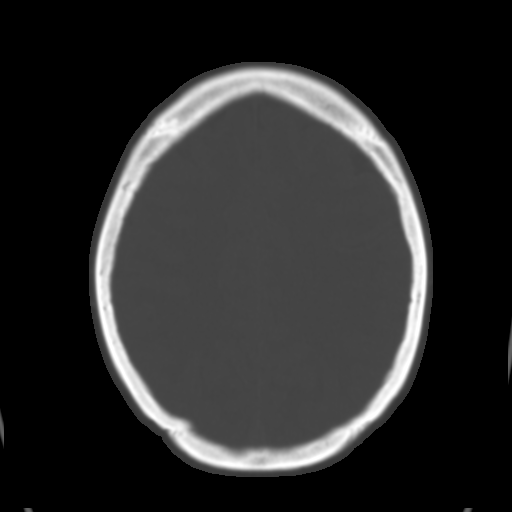
[im 23/31  brain]
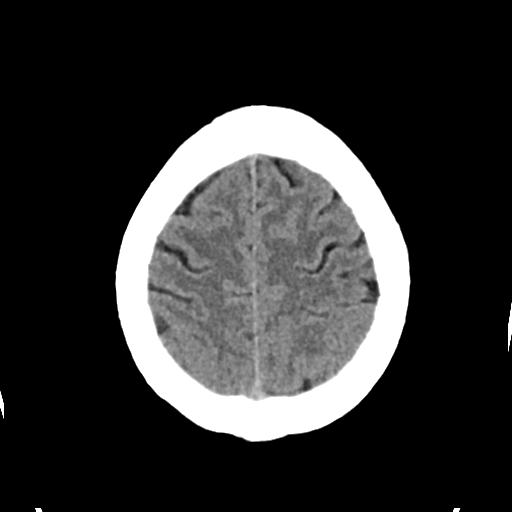
[im 27/31  brain]
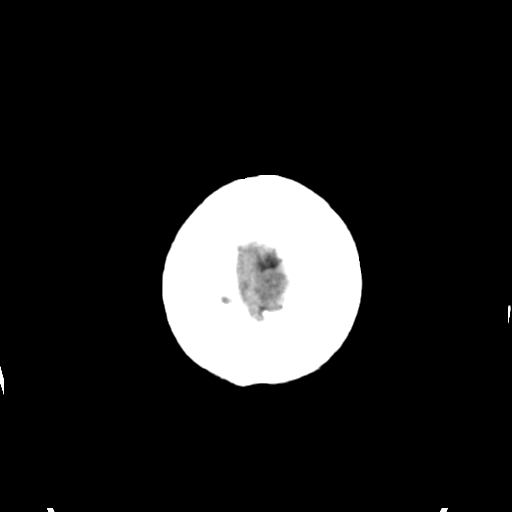

[Series 4: head bone · axial · 0.41mm/px · z∈[-160,-106]mm · 4 of 77 slices shown]
[im 8/77  bone]
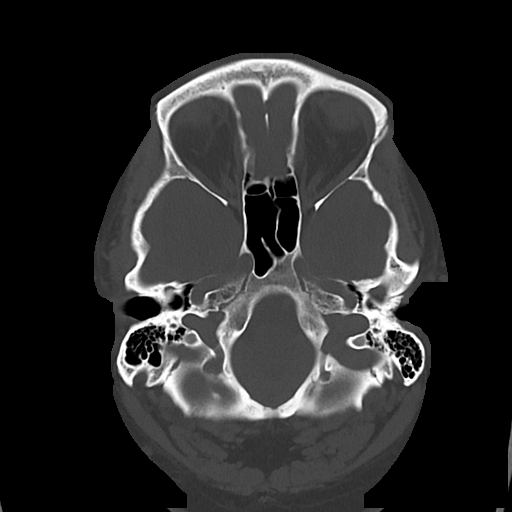
[im 16/77  bone]
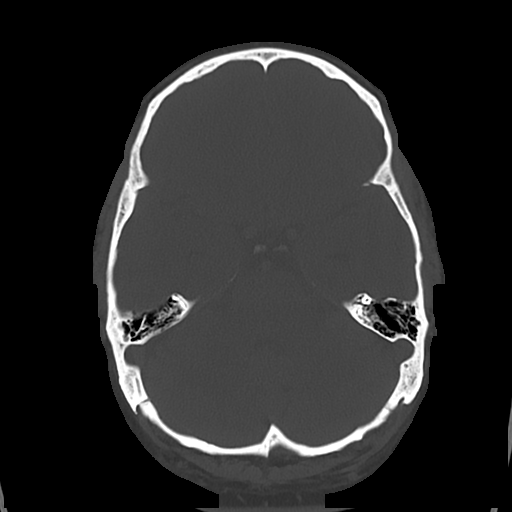
[im 23/77  bone]
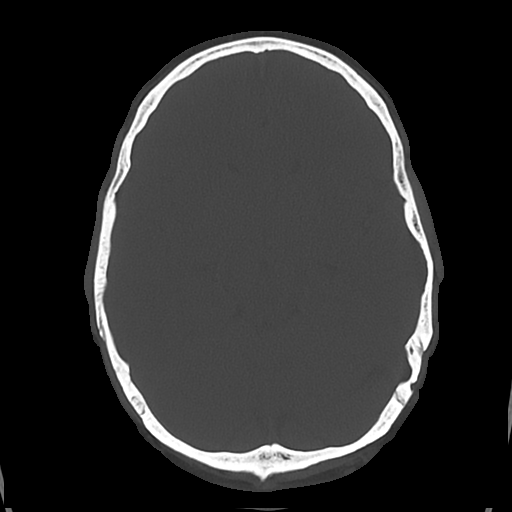
[im 35/77  bone]
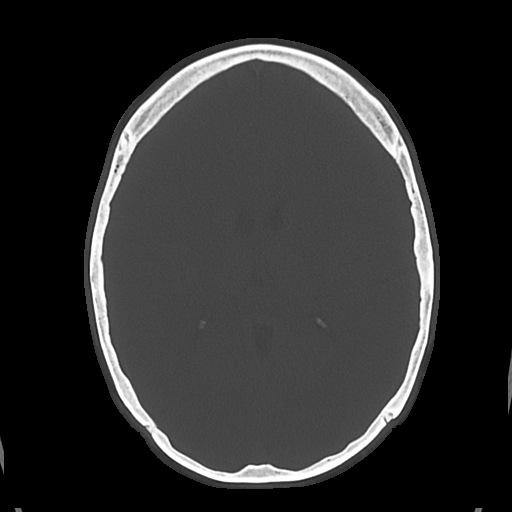

[Series 5: cor soft · coronal · 0.33mm/px · 3 of 61 slices shown]
[im 21/61  brain]
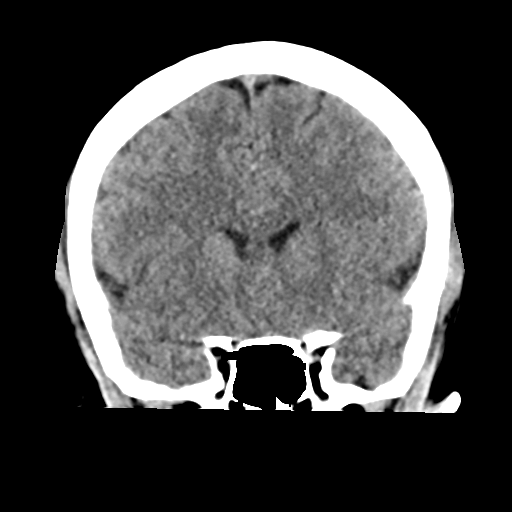
[im 27/61  brain]
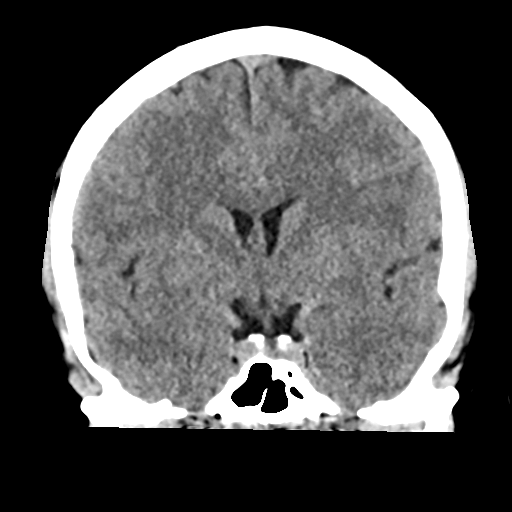
[im 34/61  brain]
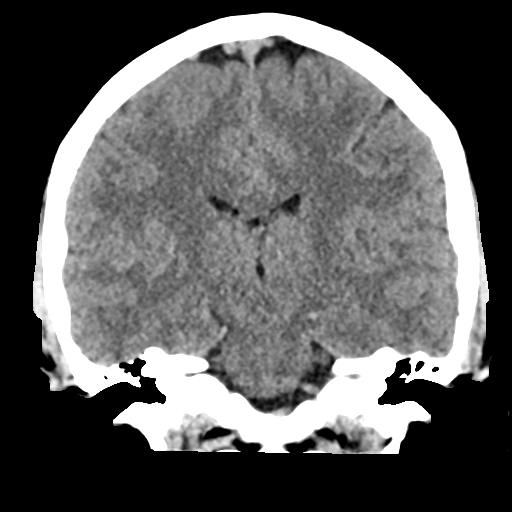

[Series 6: sag soft · sagittal · 0.33mm/px · 3 of 51 slices shown]
[im 17/51  brain]
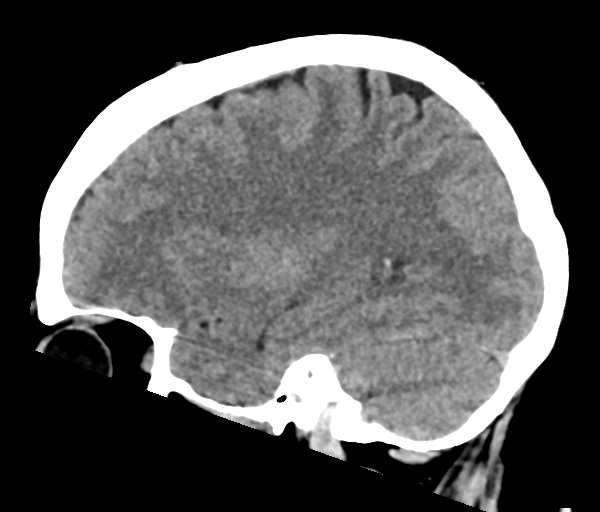
[im 26/51  brain]
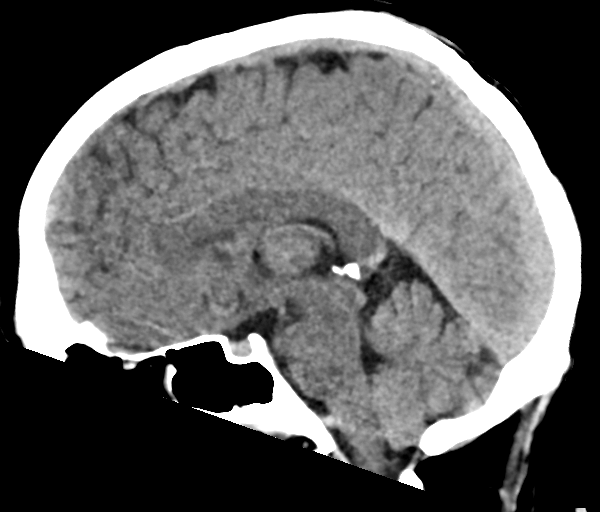
[im 34/51  brain]
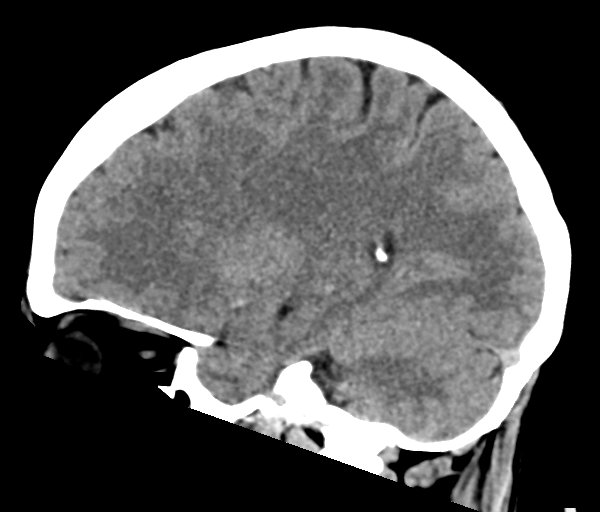

[17 of 47 positions shown; findings below may reference images not displayed]

FINDINGS: Brain: No evidence of acute infarction, hemorrhage, hydrocephalus,
extra-axial collection or mass lesion/mass effect.

Vascular: Negative for hyperdense vessel

Skull: 7 mm lytic lesion right frontal bone unchanged from 8921. No
other skull lesion identified

Sinuses/Orbits: Mild mucosal edema bilaterally.  Negative orbit

Other: None

ASPECTS (Alberta Stroke Program Early CT Score)

- Ganglionic level infarction (caudate, lentiform nuclei, internal
capsule, insula, M1-M3 cortex): 7

- Supraganglionic infarction (M4-M6 cortex): 3

Total score (0-10 with 10 being normal): 10
IMPRESSION: 1. Negative CT of the brain.  No acute abnormality.
2. ASPECTS is 10
3. Code stroke imaging results were communicated on 08/10/2021 at
[DATE] to provider [facility] via text page

## 2023-11-19 ENCOUNTER — Encounter (HOSPITAL_BASED_OUTPATIENT_CLINIC_OR_DEPARTMENT_OTHER): Payer: Self-pay | Admitting: Student

## 2023-11-19 ENCOUNTER — Ambulatory Visit (INDEPENDENT_AMBULATORY_CARE_PROVIDER_SITE_OTHER): Payer: Managed Care, Other (non HMO) | Admitting: Student

## 2023-11-19 ENCOUNTER — Encounter (HOSPITAL_BASED_OUTPATIENT_CLINIC_OR_DEPARTMENT_OTHER): Payer: Self-pay

## 2023-11-19 VITALS — BP 145/90 | HR 81 | Temp 98.2°F | Ht 66.93 in | Wt 235.4 lb

## 2023-11-19 DIAGNOSIS — E785 Hyperlipidemia, unspecified: Secondary | ICD-10-CM | POA: Diagnosis not present

## 2023-11-19 DIAGNOSIS — N529 Male erectile dysfunction, unspecified: Secondary | ICD-10-CM | POA: Diagnosis not present

## 2023-11-19 DIAGNOSIS — Z1211 Encounter for screening for malignant neoplasm of colon: Secondary | ICD-10-CM

## 2023-11-19 DIAGNOSIS — Z8673 Personal history of transient ischemic attack (TIA), and cerebral infarction without residual deficits: Secondary | ICD-10-CM | POA: Insufficient documentation

## 2023-11-19 DIAGNOSIS — E119 Type 2 diabetes mellitus without complications: Secondary | ICD-10-CM | POA: Diagnosis not present

## 2023-11-19 DIAGNOSIS — Z125 Encounter for screening for malignant neoplasm of prostate: Secondary | ICD-10-CM

## 2023-11-19 DIAGNOSIS — R030 Elevated blood-pressure reading, without diagnosis of hypertension: Secondary | ICD-10-CM | POA: Diagnosis not present

## 2023-11-19 DIAGNOSIS — Z7689 Persons encountering health services in other specified circumstances: Secondary | ICD-10-CM

## 2023-11-19 NOTE — Assessment & Plan Note (Signed)
 Currently stable on pravastatin  40 mg tablet.  Will reassess lipid panel.

## 2023-11-19 NOTE — Assessment & Plan Note (Signed)
 Resolved-history of.

## 2023-11-19 NOTE — Assessment & Plan Note (Signed)
 Patient is coming back for nurses visit to recheck BP.  May start BP medication in the future if still elevated.

## 2023-11-19 NOTE — Assessment & Plan Note (Signed)
 Patient notes that he has not had migraines since he quit his last job.  He is no longer taking Nurtec.

## 2023-11-19 NOTE — Patient Instructions (Signed)
 It was nice to see you today!  Make sure to make a nurses visit at the front for your labs.   If you have any problems before your next visit feel free to message me via MyChart (minor issues or questions) or call the office, otherwise you may reach out to schedule an office visit.  Thank you! Aireonna Bauer, PA-C

## 2023-11-19 NOTE — Progress Notes (Signed)
 New Patient Office Visit  Subjective    Patient ID: Alexander Mccormick, male    DOB: 27-Dec-1975  Age: 48 y.o. MRN: [id number]  CC:  Chief Complaint  Patient presents with   Establish Care    Here to establish care. Wife wanted pt to see a PCP, had stroke in 2019. Sex life has not been good since.    Diabetes Management Plan    Would like Alexander Mccormick to manage diabetes instead of going to Dr. Birder Mccormick (Endo).     HPI Alexander Mccormick presents to establish care. Prior PCP was unknown. Last physical was January- [facility]. he notes that he requires refills of none.  History of stroke like episode status post tPA administration (2019)- unsure if TIA, stroke, or hemiplegic migraine. He feels that he has some short term memory loss since then. Currently taking aspirin  81 mg tablet daily and pravastatin  40 mg daily.  Type 2 diabetes-last A1c is noted to be 8.1 on 10/09/23.  Record request.  No hypoglycemic events. No wounds or sores that are not healing well. No increased thirst or urination. Checking glucose at home. Taking medications as prescribed without any side effects.  On pioglitazone 45 mg tablet.  Patient was recently started on Rybelsus 7 mg tablet daily noted on 10/31/2023 to help lose weight and to help with sugar. Was in the hospital due to stomach pains on metformin. Fasting glucose on Sunday was 135. Discussed carb intake.  Patient would like to avoid traveling to [facility] for endocrinology.  Hyperlipidemia -patient is currently taking pravastatin  40 mg tablet. Tolerating stating well with no myalgias or significant side effects.   History of Hemiplegic migraines- patient is no longer getting migraines.  Unsure if patient had a true stroke as he was diagnosed with hemiplegic migraines at [facility] [place] [facility].  Currently follows with neurology?  Last seen episode was on 09/13/2021.  Patient was having headaches every 2 to 3 days-described as holocephalic with associated photophobia,  phonophobia, nausea.  History of normal CT head, MRI brain, and CTA head/neck.  History of numbness and weakness on left sided.  Triptans are contraindicated for hemiplegic migraine, was started on Nurtec for rescue medication. Patient feels that his migraines have stopped since he changed his job and his stress has drastically decreased.  Elevated BP reading- Feels that BP is always high at [employer] physicals. He does not have any way to check this at home. Discussed diet and exercise. BP log given.  Erectile Dysfunction- Hard to obtain and maintain an erection. No decreased sexual desire.   Screenings:  Colon Cancer: Cologuard ordered.  Lung Cancer: no Diabetes: A1c 10/08/24  Ophthalmology: [facility]  Foot: Done last year by Dr. Birder Mccormick. HLD: History of possible Stroke   Outpatient Encounter Medications as of 11/19/2023  Medication Sig   aspirin  EC 81 MG EC tablet Take 1 tablet (81 mg total) by mouth daily.   Multiple Vitamin (MULTIVITAMIN) tablet Take 1 tablet by mouth daily.   naphazoline-glycerin (CLEAR EYES REDNESS) 0.012-0.25 % SOLN 1-2 drops 4 (four) times daily as needed for eye irritation.   pioglitazone (ACTOS) 45 MG tablet Take 45 mg by mouth daily.   pravastatin  (PRAVACHOL ) 40 MG tablet Take 40 mg by mouth daily.   RYBELSUS 7 MG TABS Take 1 tablet by mouth every morning.   [DISCONTINUED] cholecalciferol (VITAMIN D) 25 MCG (1000 UT) tablet Take 1,000 Units by mouth daily. (Patient not taking: Reported on 11/19/2023)   [DISCONTINUED] Dapagliflozin-metFORMIN HCl  ER 02-999 MG TB24 Take 1 tablet by mouth daily. (Patient not taking: Reported on 11/19/2023)   [DISCONTINUED] Rimegepant Sulfate (NURTEC) 75 MG TBDP Take 75 mg by mouth as needed. (Patient not taking: Reported on 11/19/2023)   No facility-administered encounter medications on file as of 11/19/2023.    Past Medical History:  Diagnosis Date   Diabetes mellitus without complication (HCC)    Hypertension    Stroke (HCC)     Stroke-like episode     Past Surgical History:  Procedure Laterality Date   CORNEAL CHELATION     both    Family History  Problem Relation Age of Onset   Stroke Mother    COPD Mother    Diabetes Mother    Hearing loss Mother    Stroke Paternal Grandmother     Social History   Socioeconomic History   Marital status: Married    Spouse name: Not on file   Number of children: 3   Years of education: Not on file   Highest education level: 12th grade  Occupational History   Not on file  Tobacco Use   Smoking status: Never    Passive exposure: Never   Smokeless tobacco: Never  Vaping Use   Vaping status: Never Used  Substance and Sexual Activity   Alcohol use: Not Currently   Drug use: Not Currently   Sexual activity: Yes    Birth control/protection: None  Other Topics Concern   Not on file  Social History Narrative   Not on file   Social Drivers of Health   Financial Resource Strain: Low Risk  (11/18/2023)   Overall Financial Resource Strain (CARDIA)    Difficulty of Paying Living Expenses: Not hard at all  Food Insecurity: No Food Insecurity (11/18/2023)   Hunger Vital Sign    Worried About Running Out of Food in the Last Year: Never true    Ran Out of Food in the Last Year: Never true  Transportation Needs: No Transportation Needs (11/18/2023)   PRAPARE - Administrator, Civil Service (Medical): No    Lack of Transportation (Non-Medical): No  Physical Activity: Sufficiently Active (11/18/2023)   Exercise Vital Sign    Days of Exercise per Week: 5 days    Minutes of Exercise per Session: 30 min  Stress: No Stress Concern Present (11/18/2023)   [employer] of Occupational Health - Occupational Stress Questionnaire    Feeling of Stress : Not at all  Social Connections: Socially Integrated (11/18/2023)   Social Connection and Isolation Panel [NHANES]    Frequency of Communication with Friends and Family: More than three times a week    Frequency of  Social Gatherings with Friends and Family: Never    Attends Religious Services: More than 4 times per year    Active Member of [employer] or Organizations: Yes    Attends Engineer, structural: More than 4 times per year    Marital Status: Married  Catering manager Violence: Unknown (01/12/2022)   Received from [employer], [facility]   HITS    Physically Hurt: Not on file    Insult or Talk Down To: Not on file    Threaten Physical Harm: Not on file    Scream or Curse: Not on file    ROS  Per HPI      Objective    BP (!) 145/90 (BP Location: Right Arm, Patient Position: Sitting, Cuff Size: Normal)   Pulse 81   Temp 98.2  F (36.8 C) (Oral)   Ht 5' 6.93" (1.7 m)   Wt 235 lb 6.4 oz (106.8 kg)   SpO2 98%   BMI 36.95 kg/m   Physical Exam Constitutional:      General: He is not in acute distress.    Appearance: Normal appearance. He is not ill-appearing.  HENT:     Head: Normocephalic and atraumatic.     Right Ear: External ear normal.     Left Ear: External ear normal.     Nose: Nose normal.     Mouth/Throat:     Mouth: Mucous membranes are moist.     Pharynx: Oropharynx is clear.  Eyes:     General: No scleral icterus.    Extraocular Movements: Extraocular movements intact.     Conjunctiva/sclera: Conjunctivae normal.     Pupils: Pupils are equal, round, and reactive to light.  Neck:     Vascular: No carotid bruit.  Cardiovascular:     Rate and Rhythm: Normal rate and regular rhythm.     Pulses: Normal pulses.     Heart sounds: Normal heart sounds. No murmur heard.    No friction rub.  Pulmonary:     Effort: Pulmonary effort is normal. No respiratory distress.     Breath sounds: Normal breath sounds. No wheezing, rhonchi or rales.  Musculoskeletal:        General: Normal range of motion.     Cervical back: Neck supple.     Right lower leg: No edema.     Left lower leg: No edema.  Lymphadenopathy:     Cervical: No cervical adenopathy.  Skin:     General: Skin is warm and dry.  Neurological:     General: No focal deficit present.     Mental Status: He is alert.  Psychiatric:        Mood and Affect: Mood normal.        Behavior: Behavior normal.         Assessment & Plan:   Encounter to establish care  Type 2 diabetes mellitus without complication, without long-term current use of insulin (HCC) Assessment & Plan: Currently following with endocrinology but would like to avoid traveling to [facility] for management of his type 2 diabetes-patient desires for primary care to take over.  His last A1c was noted to be 8.1 on 10/09/2023.  Fasting glucose this past Sunday was 135.  Currently on pioglitazone 45 mg and Rybelsus 7 mg.  Discussed carb intake.  If next A1c is elevated may discuss increasing Rybelsus to 14 mg tablet daily.  Orders: -     CBC with Differential/Platelet; Future -     Comprehensive metabolic panel; Future  Hyperlipidemia, unspecified hyperlipidemia type Assessment & Plan: Currently stable on pravastatin  40 mg tablet.  Will reassess lipid panel.  Orders: -     Lipid panel; Future  Elevated blood pressure reading Assessment & Plan: Patient is coming back for nurses visit to recheck BP.  May start BP medication in the future if still elevated.   Erectile dysfunction, unspecified erectile dysfunction type Assessment & Plan: Possible hypogonadism, will plan to get an early morning testosterone  level.  Orders: -     Testosterone ; Future  History of stroke-like episode Assessment & Plan: In 2019.  Neurology believes that this was a hemiplegic migraine.  Patient believes that he has residual short-term memory loss but this has never been worked up.  Patient is currently taking daily 81 mg aspirin  and pravastatin  40  mg tablet.   Screen for colon cancer -     Cologuard  Screening for malignant neoplasm of prostate -     PSA; Future   I have spent greater than 45 minutes charting, educating,  diagnosing and managing this patient for this visit.   Return in about 6 weeks (around 12/31/2023) for Chronic Followup, DM (raise rybelsus).   Heidy Mccubbin T Cade Olberding, PA-C

## 2023-11-19 NOTE — Assessment & Plan Note (Signed)
 Possible hypogonadism, will plan to get an early morning testosterone  level.

## 2023-11-19 NOTE — Assessment & Plan Note (Signed)
 Currently following with endocrinology but would like to avoid traveling to [facility] for management of his type 2 diabetes-patient desires for primary care to take over.  His last A1c was noted to be 8.1 on 10/09/2023.  Fasting glucose this past Sunday was 135.  Currently on pioglitazone 45 mg and Rybelsus 7 mg.  Discussed carb intake.  If next A1c is elevated may discuss increasing Rybelsus to 14 mg tablet daily.

## 2023-11-19 NOTE — Assessment & Plan Note (Signed)
 In 2019.  Neurology believes that this was a hemiplegic migraine.  Patient believes that he has residual short-term memory loss but this has never been worked up.  Patient is currently taking daily 81 mg aspirin  and pravastatin  40 mg tablet.

## 2023-11-23 ENCOUNTER — Other Ambulatory Visit (HOSPITAL_BASED_OUTPATIENT_CLINIC_OR_DEPARTMENT_OTHER): Payer: Self-pay

## 2023-11-23 DIAGNOSIS — E119 Type 2 diabetes mellitus without complications: Secondary | ICD-10-CM

## 2023-11-23 DIAGNOSIS — E785 Hyperlipidemia, unspecified: Secondary | ICD-10-CM

## 2023-11-23 DIAGNOSIS — N529 Male erectile dysfunction, unspecified: Secondary | ICD-10-CM

## 2023-11-23 DIAGNOSIS — Z125 Encounter for screening for malignant neoplasm of prostate: Secondary | ICD-10-CM

## 2023-11-27 ENCOUNTER — Other Ambulatory Visit (HOSPITAL_BASED_OUTPATIENT_CLINIC_OR_DEPARTMENT_OTHER): Payer: Managed Care, Other (non HMO)

## 2023-11-27 DIAGNOSIS — N529 Male erectile dysfunction, unspecified: Secondary | ICD-10-CM

## 2023-11-27 DIAGNOSIS — Z125 Encounter for screening for malignant neoplasm of prostate: Secondary | ICD-10-CM

## 2023-11-27 DIAGNOSIS — E119 Type 2 diabetes mellitus without complications: Secondary | ICD-10-CM

## 2023-11-27 DIAGNOSIS — E785 Hyperlipidemia, unspecified: Secondary | ICD-10-CM

## 2023-11-28 ENCOUNTER — Encounter (HOSPITAL_BASED_OUTPATIENT_CLINIC_OR_DEPARTMENT_OTHER): Payer: Self-pay | Admitting: Student

## 2023-11-28 LAB — CBC WITH DIFFERENTIAL/PLATELET
Basophils Absolute: 0 10*3/uL (ref 0.0–0.2)
Basos: 0 %
EOS (ABSOLUTE): 0.1 10*3/uL (ref 0.0–0.4)
Eos: 1 %
Hematocrit: 44 % (ref 37.5–51.0)
Hemoglobin: 14.8 g/dL (ref 13.0–17.7)
Immature Grans (Abs): 0 10*3/uL (ref 0.0–0.1)
Immature Granulocytes: 0 %
Lymphocytes Absolute: 1.9 10*3/uL (ref 0.7–3.1)
Lymphs: 32 %
MCH: 32 pg (ref 26.6–33.0)
MCHC: 33.6 g/dL (ref 31.5–35.7)
MCV: 95 fL (ref 79–97)
Monocytes Absolute: 0.4 10*3/uL (ref 0.1–0.9)
Monocytes: 7 %
Neutrophils Absolute: 3.4 10*3/uL (ref 1.4–7.0)
Neutrophils: 60 %
Platelets: 367 10*3/uL (ref 150–450)
RBC: 4.62 x10E6/uL (ref 4.14–5.80)
RDW: 12.7 % (ref 11.6–15.4)
WBC: 5.9 10*3/uL (ref 3.4–10.8)

## 2023-11-28 LAB — COMPREHENSIVE METABOLIC PANEL
ALT: 16 [IU]/L (ref 0–44)
AST: 18 [IU]/L (ref 0–40)
Albumin: 4.1 g/dL (ref 4.1–5.1)
Alkaline Phosphatase: 91 [IU]/L (ref 44–121)
BUN/Creatinine Ratio: 10 (ref 9–20)
BUN: 11 mg/dL (ref 6–24)
Bilirubin Total: 0.2 mg/dL (ref 0.0–1.2)
CO2: 26 mmol/L (ref 20–29)
Calcium: 9.5 mg/dL (ref 8.7–10.2)
Chloride: 105 mmol/L (ref 96–106)
Creatinine, Ser: 1.06 mg/dL (ref 0.76–1.27)
Globulin, Total: 2.5 g/dL (ref 1.5–4.5)
Glucose: 114 mg/dL — ABNORMAL HIGH (ref 70–99)
Potassium: 4.7 mmol/L (ref 3.5–5.2)
Sodium: 139 mmol/L (ref 134–144)
Total Protein: 6.6 g/dL (ref 6.0–8.5)
eGFR: 87 mL/min/{1.73_m2} (ref >59)

## 2023-11-28 LAB — TESTOSTERONE: Testosterone: 748 ng/dL (ref 264–916)

## 2023-11-28 LAB — LIPID PANEL
Chol/HDL Ratio: 3.4 {ratio} (ref 0.0–5.0)
Cholesterol, Total: 131 mg/dL (ref 100–199)
HDL: 38 mg/dL — ABNORMAL LOW (ref >39)
LDL Chol Calc (NIH): 73 mg/dL (ref 0–99)
Triglycerides: 109 mg/dL (ref 0–149)
VLDL Cholesterol Cal: 20 mg/dL (ref 5–40)

## 2023-11-28 LAB — PSA: Prostate Specific Ag, Serum: 0.6 ng/mL (ref 0.0–4.0)

## 2023-12-22 LAB — COLOGUARD: COLOGUARD: POSITIVE — AB

## 2023-12-24 ENCOUNTER — Encounter (HOSPITAL_BASED_OUTPATIENT_CLINIC_OR_DEPARTMENT_OTHER): Payer: Self-pay | Admitting: Student

## 2023-12-24 ENCOUNTER — Other Ambulatory Visit (HOSPITAL_BASED_OUTPATIENT_CLINIC_OR_DEPARTMENT_OTHER): Payer: Self-pay | Admitting: Student

## 2023-12-24 DIAGNOSIS — R195 Other fecal abnormalities: Secondary | ICD-10-CM

## 2023-12-28 ENCOUNTER — Encounter: Payer: Self-pay | Admitting: Gastroenterology

## 2023-12-31 ENCOUNTER — Ambulatory Visit (HOSPITAL_BASED_OUTPATIENT_CLINIC_OR_DEPARTMENT_OTHER): Payer: Managed Care, Other (non HMO) | Admitting: Student

## 2023-12-31 ENCOUNTER — Encounter (HOSPITAL_BASED_OUTPATIENT_CLINIC_OR_DEPARTMENT_OTHER): Payer: Self-pay | Admitting: Student

## 2023-12-31 VITALS — BP 132/88 | HR 70 | Temp 97.7°F | Ht 70.0 in | Wt 236.2 lb

## 2023-12-31 DIAGNOSIS — S39012A Strain of muscle, fascia and tendon of lower back, initial encounter: Secondary | ICD-10-CM | POA: Diagnosis not present

## 2023-12-31 DIAGNOSIS — E782 Mixed hyperlipidemia: Secondary | ICD-10-CM

## 2023-12-31 DIAGNOSIS — N529 Male erectile dysfunction, unspecified: Secondary | ICD-10-CM

## 2023-12-31 DIAGNOSIS — I10 Essential (primary) hypertension: Secondary | ICD-10-CM | POA: Diagnosis not present

## 2023-12-31 MED ORDER — SILDENAFIL CITRATE 100 MG PO TABS
50.0000 mg | ORAL_TABLET | Freq: Every day | ORAL | 11 refills | Status: AC | PRN
Start: 2023-12-31 — End: ?

## 2023-12-31 MED ORDER — MELOXICAM 7.5 MG PO TABS: 7.5000 mg | ORAL_TABLET | Freq: Every day | ORAL | 0 refills | Status: DC

## 2023-12-31 NOTE — Assessment & Plan Note (Signed)
 Stable on pravastatin 40 mg tablet-continue current regimen. Last lipid panel on 11/27/2023 was stable.

## 2023-12-31 NOTE — Assessment & Plan Note (Signed)
 Testosterone level in the past was within normal limits. Order sildenafil to be used as needed-patient provided with education.  Discussed beginning at 1/2 tablet.

## 2023-12-31 NOTE — Assessment & Plan Note (Signed)
 Patient was tender to palpation of right sided paraspinous muscles.  No red flag symptoms. Order meloxicam to be used twice daily as needed for pain relief and inflammation decreased.

## 2023-12-31 NOTE — Patient Instructions (Addendum)
 It was nice to see you today!  As we discussed in clinic please keep an eye on your BP.  He is informed that Viagra is sometimes not covered by insurance. It is available on a fee-for-service cost basis, and is relatively expensive. You can start with 50 mg dose, and increase to 100 mg if necessary. Please take the medication about 1 hour prior to intercourse. You should not use any more than one tablet in a 24 hour period. The side effects include possible headache, flushing, dyspepsia and transient changes in vision.  If you have any problems before your next visit feel free to message me via MyChart (minor issues or questions) or call the office, otherwise you may reach out to schedule an office visit.  Thank you! Gerilyn Pilgrim Hiilani Jetter, PA-C

## 2023-12-31 NOTE — Assessment & Plan Note (Signed)
 No red flag symptoms-minor blood pressure issues at BP of 132/88. Patient denied medication today. Discussed lifestyle modification.

## 2023-12-31 NOTE — Progress Notes (Signed)
 Established Patient Office Visit  Subjective   Patient ID: Alexander Mccormick, male    DOB: [birth year]  Age: 48 y.o. MRN: [id number]  Chief Complaint  Patient presents with   Follow-up    Pt. Is here for a follow-up visit.    HPI  Hypertension- Stable. Pt denies chest pain, SOB, dizziness, or heart palpitations.  Denies medication today. Discussed lifestyle modifications.  Low back pain- Patient states when he goes to the bathroom and has a bowel movement he gets lower back pain that lasts around 45 minutes.  Denies pain with urination and saddle anesthesia. 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 Not taking anything for the pain.  Occasional Tylenol takes care of it when needed. Does not feel like he is straining with defecation.  He is not remember anything that he did to hurt his back.  Erectile dysfunction- States that he has a hard time maintaining and obtaining an erection. Worsening in the last six months. Encouraged him to increase his cardio and increase overall exercise.  Last testosterone level was within normal limits.  Patient has agreeable to sildenafil as needed. 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 Cholesterol is well managed as below.  Hyperlipidemia - Stable. Tolerating statin well with no myalgias or significant side effects.  Lab Results  Component Value Date   CHOL 131 11/27/2023   HDL 38 (L) 11/27/2023   LDLCALC 73 11/27/2023   TRIG 109 11/27/2023   CHOLHDL 3.4 11/27/2023   Colonoscopy is scheduled for 02/15/2024.     ROS Negative unless indicated in HPI.    Objective:     BP 132/88   Pulse 70   Temp 97.7 F (36.5 C) (Oral)   Ht 5\' 10"  (1.778 m)   Wt 236 lb 3.2 oz (107.1 kg)   SpO2 96%   BMI 33.89 kg/m    Physical Exam Constitutional:      General: He is not in acute distress.    Appearance: Normal appearance. He is not  ill-appearing.  HENT:     Head: Normocephalic and atraumatic.     Right Ear: External ear normal.     Left Ear: External ear normal.     Nose: Nose normal.  Eyes:     Conjunctiva/sclera: Conjunctivae normal.  Cardiovascular:     Rate and Rhythm: Normal rate and regular rhythm.     Pulses: Normal pulses.     Heart sounds: Normal heart sounds. No murmur heard.    No friction rub.  Pulmonary:     Effort: Pulmonary effort is normal. No respiratory distress.     Breath sounds: Normal breath sounds. No wheezing, rhonchi or rales.  Musculoskeletal:     Comments: Tenderness to R sided paraspinous muscles. No midline tenderness. Flexion, extension, and rotational movements all caused minor pain.  Skin:    General: Skin is warm and dry.     Coloration: Skin is not jaundiced or pale.  Neurological:     General: No focal deficit present.     Mental Status: He is alert.  Psychiatric:        Mood and Affect: Mood normal.        Behavior: Behavior normal.      No results found for any visits on 12/31/23.    The ASCVD Risk score (Arnett DK, et al., 2019) failed to calculate for the following  reasons:   Risk score cannot be calculated because patient has a medical history suggesting prior/existing ASCVD    Assessment & Plan:   Erectile dysfunction, unspecified erectile dysfunction type Assessment & Plan: Testosterone level in the past was within normal limits. Order sildenafil to be used as needed-patient provided with education.  Discussed beginning at 1/2 tablet.   Orders: -     Sildenafil Citrate; Take 0.5-1 tablets (50-100 mg total) by mouth daily as needed for erectile dysfunction.  Dispense: 10 tablet; Refill: 11  Strain of lumbar region, initial encounter Assessment & Plan: Patient was tender to palpation of right sided paraspinous muscles.  No red flag symptoms. Order meloxicam to be used twice daily as needed for pain relief and inflammation decreased.  Orders: -      Meloxicam; Take 1 tablet (7.5 mg total) by mouth daily.  Dispense: 30 tablet; Refill: 0  Mixed hyperlipidemia Assessment & Plan: Stable on pravastatin 40 mg tablet-continue current regimen. Last lipid panel on 11/27/2023 was stable.   Primary hypertension Assessment & Plan: No red flag symptoms-minor blood pressure issues at BP of 132/88. Patient denied medication today. Discussed lifestyle modification.      Return in about 3 months (around 04/01/2024) for HTN, DM.    Teryl Lucy Heliodoro Domagalski, PA-C

## 2024-01-31 ENCOUNTER — Encounter

## 2024-02-05 ENCOUNTER — Ambulatory Visit (AMBULATORY_SURGERY_CENTER)

## 2024-02-05 VITALS — Ht 66.0 in | Wt 219.0 lb

## 2024-02-05 DIAGNOSIS — Z1211 Encounter for screening for malignant neoplasm of colon: Secondary | ICD-10-CM

## 2024-02-05 DIAGNOSIS — R195 Other fecal abnormalities: Secondary | ICD-10-CM

## 2024-02-05 MED ORDER — NA SULFATE-K SULFATE-MG SULF 17.5-3.13-1.6 GM/177ML PO SOLN: 1.0000 | Freq: Once | ORAL | 0 refills | Status: AC

## 2024-02-05 NOTE — Progress Notes (Signed)

## 2024-02-06 ENCOUNTER — Encounter: Payer: Self-pay | Admitting: Gastroenterology

## 2024-02-08 ENCOUNTER — Telehealth (HOSPITAL_BASED_OUTPATIENT_CLINIC_OR_DEPARTMENT_OTHER): Payer: Self-pay

## 2024-02-08 NOTE — Telephone Encounter (Signed)
 Patient is on RYBELSUS 7 MG TABS and has developed a bad case of diarrhea and stopped taking it for about a week and a half and hasn't had any symptoms. He would like to know if he can take something else or if his pcp needs to see him to determine what's next

## 2024-02-11 NOTE — Telephone Encounter (Signed)
 Pt said he will call back or set up appt on mychart.

## 2024-02-15 ENCOUNTER — Encounter (HOSPITAL_COMMUNITY): Payer: Self-pay

## 2024-02-15 ENCOUNTER — Ambulatory Visit (AMBULATORY_SURGERY_CENTER): Admitting: Gastroenterology

## 2024-02-15 ENCOUNTER — Encounter: Payer: Self-pay | Admitting: Gastroenterology

## 2024-02-15 VITALS — BP 116/70 | HR 77 | Temp 97.5°F | Resp 17 | Ht 70.0 in | Wt 219.0 lb

## 2024-02-15 DIAGNOSIS — K644 Residual hemorrhoidal skin tags: Secondary | ICD-10-CM

## 2024-02-15 DIAGNOSIS — D125 Benign neoplasm of sigmoid colon: Secondary | ICD-10-CM

## 2024-02-15 DIAGNOSIS — D123 Benign neoplasm of transverse colon: Secondary | ICD-10-CM

## 2024-02-15 DIAGNOSIS — R195 Other fecal abnormalities: Secondary | ICD-10-CM | POA: Diagnosis not present

## 2024-02-15 DIAGNOSIS — D128 Benign neoplasm of rectum: Secondary | ICD-10-CM

## 2024-02-15 DIAGNOSIS — K5730 Diverticulosis of large intestine without perforation or abscess without bleeding: Secondary | ICD-10-CM | POA: Diagnosis not present

## 2024-02-15 DIAGNOSIS — K648 Other hemorrhoids: Secondary | ICD-10-CM

## 2024-02-15 DIAGNOSIS — Z1211 Encounter for screening for malignant neoplasm of colon: Secondary | ICD-10-CM | POA: Diagnosis present

## 2024-02-15 MED ORDER — SODIUM CHLORIDE 0.9 % IV SOLN: 500.0000 mL | INTRAVENOUS | Status: DC

## 2024-02-15 NOTE — Patient Instructions (Signed)

## 2024-02-15 NOTE — Op Note (Signed)
 [facility] Patient Name: Alexander Mccormick Procedure Date: 02/15/2024 10:02 AM MRN: [id number] Endoscopist: Sergio Dandy , MD, [id number] Age: 48 Referring MD:  Date of Birth: Jul 10, 1976 Gender: Male Account #: [id number] Procedure:                Colonoscopy Indications:              Positive Cologuard test Medicines:                Monitored Anesthesia Care Procedure:                Pre-Anesthesia Assessment:                           - Prior to the procedure, a History and Physical                            was performed, and patient medications and                            allergies were reviewed. The patient's tolerance of                            previous anesthesia was also reviewed. The risks                            and benefits of the procedure and the sedation                            options and risks were discussed with the patient.                            All questions were answered, and informed consent                            was obtained. Prior Anticoagulants: The patient has                            taken no anticoagulant or antiplatelet agents. ASA                            Grade Assessment: III - A patient with severe                            systemic disease. After reviewing the risks and                            benefits, the patient was deemed in satisfactory                            condition to undergo the procedure.                           After obtaining informed consent, the colonoscope  was passed under direct vision. Throughout the                            procedure, the patient's blood pressure, pulse, and                            oxygen saturations were monitored continuously. The                            [id number]-HQ190L Colonoscope [id number] was introduced                            through the anus and advanced to the the cecum,                            identified by appendiceal orifice  and ileocecal                            valve. The colonoscopy was performed without                            difficulty. The patient tolerated the procedure                            well. The quality of the bowel preparation was                            good. The ileocecal valve, appendiceal orifice, and                            rectum were photographed. Scope In: 10:18:47 AM Scope Out: 10:32:37 AM Scope Withdrawal Time: 0 hours 10 minutes 23 seconds  Total Procedure Duration: 0 hours 13 minutes 50 seconds  Findings:                 The perianal and digital rectal examinations were                            normal.                           Three sessile polyps were found in the rectum,                            sigmoid colon and transverse colon. The polyps were                            4 to 7 mm in size. These polyps were removed with a                            cold snare. Resection and retrieval were complete.                           Scattered small-mouthed diverticula were found in  the sigmoid colon and descending colon.                           Non-bleeding external and internal hemorrhoids were                            found during retroflexion. The hemorrhoids were                            medium-sized. Complications:            No immediate complications. Estimated Blood Loss:     Estimated blood loss was minimal. Impression:               - Three 4 to 7 mm polyps in the rectum, in the                            sigmoid colon and in the transverse colon, removed                            with a cold snare. Resected and retrieved.                           - Diverticulosis in the sigmoid colon and in the                            descending colon.                           - Non-bleeding external and internal hemorrhoids. Recommendation:           - Patient has a contact number available for                             emergencies. The signs and symptoms of potential                            delayed complications were discussed with the                            patient. Return to normal activities tomorrow.                            Written discharge instructions were provided to the                            patient.                           - Resume previous diet.                           - Continue present medications.                           - Await pathology results.                           -  Repeat colonoscopy in 3 - 5 years for                            surveillance based on pathology results. Davion Flannery V. Tameca Jerez, MD 02/15/2024 10:39:27 AM This report has been signed electronically.

## 2024-02-15 NOTE — Progress Notes (Signed)
 Vss nad trans to pacu

## 2024-02-15 NOTE — Progress Notes (Signed)
 [facility] History and Physical   Primary Care Physician:  Rothfuss, Laron Plummer, PA-C   Reason for Procedure:  Positive Cologuard   Plan:     colonoscopy with possible interventions as needed     HPI: Alexander Mccormick is a very pleasant 48 y.o. male here for  colonoscopy for positive cologuard. Denies any nausea, vomiting, abdominal pain, melena or bright red blood per rectum  The risks and benefits as well as alternatives of endoscopic procedure(s) have been discussed and reviewed. All questions answered. The patient agrees to proceed.    Past Medical History:  Diagnosis Date   Diabetes mellitus without complication (HCC)    Hypertension    Stroke (HCC) 09/2018   Stroke-like episode     Past Surgical History:  Procedure Laterality Date   CORNEAL CHELATION     both    Prior to Admission medications   Medication Sig Start Date End Date Taking? Authorizing Provider  aspirin  EC 81 MG EC tablet Take 1 tablet (81 mg total) by mouth daily. 09/30/18  Yes Rodman Clam, NP  meloxicam  (MOBIC ) 7.5 MG tablet Take 1 tablet (7.5 mg total) by mouth daily. 12/31/23  Yes Rothfuss, Jacob T, PA-C  Multiple Vitamin (MULTIVITAMIN) tablet Take 1 tablet by mouth daily.   Yes [provider]  naphazoline-glycerin (CLEAR EYES REDNESS) 0.012-0.25 % SOLN 1-2 drops 4 (four) times daily as needed for eye irritation.   Yes [provider]  pioglitazone (ACTOS) 45 MG tablet Take 45 mg by mouth daily. 09/23/18  Yes [provider]  pravastatin  (PRAVACHOL ) 40 MG tablet Take 40 mg by mouth daily. 09/06/18  Yes [provider]  sildenafil  (VIAGRA ) 100 MG tablet Take 0.5-1 tablets (50-100 mg total) by mouth daily as needed for erectile dysfunction. 12/31/23  Yes Rothfuss, Jacob T, PA-C  RYBELSUS 7 MG TABS Take 1 tablet by mouth every morning. Patient not taking: Reported on 02/05/2024    [provider]    Current Outpatient Medications  Medication Sig  Dispense Refill   aspirin  EC 81 MG EC tablet Take 1 tablet (81 mg total) by mouth daily.     meloxicam  (MOBIC ) 7.5 MG tablet Take 1 tablet (7.5 mg total) by mouth daily. 30 tablet 0   Multiple Vitamin (MULTIVITAMIN) tablet Take 1 tablet by mouth daily.     naphazoline-glycerin (CLEAR EYES REDNESS) 0.012-0.25 % SOLN 1-2 drops 4 (four) times daily as needed for eye irritation.     pioglitazone (ACTOS) 45 MG tablet Take 45 mg by mouth daily.     pravastatin  (PRAVACHOL ) 40 MG tablet Take 40 mg by mouth daily.     sildenafil  (VIAGRA ) 100 MG tablet Take 0.5-1 tablets (50-100 mg total) by mouth daily as needed for erectile dysfunction. 10 tablet 11   RYBELSUS 7 MG TABS Take 1 tablet by mouth every morning. (Patient not taking: Reported on 02/05/2024)     Current Facility-Administered Medications  Medication Dose Route Frequency Provider Last Rate Last Admin   0.9 %  sodium chloride  infusion  500 mL Intravenous Continuous Petrona Wyeth V, MD        Allergies as of 02/15/2024 - Review Complete 02/15/2024  Allergen Reaction Noted   Sulfamethoxazole-trimethoprim Rash 05/02/2018    Family History  Problem Relation Age of Onset   Stroke Mother    COPD Mother    Diabetes Mother    Hearing loss Mother    Colon polyps Father    Stroke Paternal Grandmother  Colon cancer Neg Hx    Esophageal cancer Neg Hx    Rectal cancer Neg Hx    Stomach cancer Neg Hx     Social History   Socioeconomic History   Marital status: Married    Spouse name: Not on file   Number of children: 3   Years of education: Not on file   Highest education level: 12th grade  Occupational History   Not on file  Tobacco Use   Smoking status: Never    Passive exposure: Never   Smokeless tobacco: Never  Vaping Use   Vaping status: Never Used  Substance and Sexual Activity   Alcohol use: Not Currently   Drug use: Not Currently   Sexual activity: Yes    Birth control/protection: None  Other Topics Concern   Not  on file  Social History Narrative   Not on file   Social Drivers of Health   Financial Resource Strain: Low Risk  (11/18/2023)   Overall Financial Resource Strain (CARDIA)    Difficulty of Paying Living Expenses: Not hard at all  Food Insecurity: No Food Insecurity (11/18/2023)   Hunger Vital Sign    Worried About Running Out of Food in the Last Year: Never true    Ran Out of Food in the Last Year: Never true  Transportation Needs: No Transportation Needs (11/18/2023)   PRAPARE - Administrator, Civil Service (Medical): No    Lack of Transportation (Non-Medical): No  Physical Activity: Sufficiently Active (11/18/2023)   Exercise Vital Sign    Days of Exercise per Week: 5 days    Minutes of Exercise per Session: 30 min  Stress: No Stress Concern Present (11/18/2023)   [employer] of Occupational Health - Occupational Stress Questionnaire    Feeling of Stress : Not at all  Social Connections: Socially Integrated (11/18/2023)   Social Connection and Isolation Panel [NHANES]    Frequency of Communication with Friends and Family: More than three times a week    Frequency of Social Gatherings with Friends and Family: Never    Attends Religious Services: More than 4 times per year    Active Member of [employer] or Organizations: Yes    Attends Engineer, structural: More than 4 times per year    Marital Status: Married  Catering manager Violence: Unknown (01/12/2022)   Received from [employer], [facility]   HITS    Physically Hurt: Not on file    Insult or Talk Down To: Not on file    Threaten Physical Harm: Not on file    Scream or Curse: Not on file    Review of Systems:  All other review of systems negative except as mentioned in the HPI.  Physical Exam: Vital signs in last 24 hours: BP (!) 149/88   Pulse (!) 101   Temp (!) 97.5 F (36.4 C) (Temporal)   Resp 18   Ht 5\' 10"  (1.778 m)   Wt 219 lb (99.3 kg)   SpO2 99%   BMI 31.42 kg/m  General:   Alert,  NAD Lungs:  Clear .   Heart:  Regular rate and rhythm Abdomen:  Soft, nontender and nondistended. Neuro/Psych:  Alert and cooperative. Normal mood and affect. A and O x 3  Reviewed labs, radiology imaging, old records and pertinent past GI work up  Patient is appropriate for planned procedure(s) and anesthesia in an ambulatory setting   K. Veena Tylen Leverich , MD [phone]

## 2024-02-15 NOTE — Progress Notes (Signed)
 Called to room to assist during endoscopic procedure.  Patient ID and intended procedure confirmed with present staff. Received instructions for my participation in the procedure from the performing physician.

## 2024-02-18 ENCOUNTER — Telehealth: Payer: Self-pay | Admitting: *Deleted

## 2024-02-18 NOTE — Telephone Encounter (Signed)
  Follow up Call-     02/15/2024    9:17 AM  Call back number  Post procedure Call Back phone  # [phone]  Permission to leave phone message Yes     Patient questions:  Do you have a fever, pain , or abdominal swelling? No. Pain Score  0 *  Have you tolerated food without any problems? Yes.    Have you been able to return to your normal activities? Yes.    Do you have any questions about your discharge instructions: Diet   No. Medications  No. Follow up visit  No.  Do you have questions or concerns about your Care? No.  Actions: * If pain score is 4 or above: No action needed, pain <4.

## 2024-02-19 LAB — SURGICAL PATHOLOGY

## 2024-02-22 ENCOUNTER — Encounter (HOSPITAL_BASED_OUTPATIENT_CLINIC_OR_DEPARTMENT_OTHER): Payer: Self-pay | Admitting: Student

## 2024-02-22 ENCOUNTER — Ambulatory Visit (HOSPITAL_BASED_OUTPATIENT_CLINIC_OR_DEPARTMENT_OTHER): Admitting: Student

## 2024-02-22 VITALS — BP 132/83 | HR 72 | Temp 97.7°F | Resp 16 | Ht 70.0 in | Wt 228.0 lb

## 2024-02-22 DIAGNOSIS — T50905A Adverse effect of unspecified drugs, medicaments and biological substances, initial encounter: Secondary | ICD-10-CM

## 2024-02-22 DIAGNOSIS — E119 Type 2 diabetes mellitus without complications: Secondary | ICD-10-CM | POA: Diagnosis not present

## 2024-02-22 MED ORDER — OZEMPIC (0.25 OR 0.5 MG/DOSE) 2 MG/3ML ~~LOC~~ SOPN: 0.2500 mg | PEN_INJECTOR | SUBCUTANEOUS | 4 refills | Status: DC

## 2024-02-22 NOTE — Patient Instructions (Signed)
 It was nice to see you today!  As we discussed in clinic:  Make sure to go to the ozempic website and fill out your information for a coupon card to help lower the cost for you.  If you have any problems before your next visit feel free to message me via MyChart (minor issues or questions) or call the office, otherwise you may reach out to schedule an office visit.  Thank you! Satya Buttram, PA-C

## 2024-02-22 NOTE — Progress Notes (Signed)
 Established Patient Office Visit  Subjective   Patient ID: Alexander Mccormick, male    DOB: 01/20/1976  Age: 48 y.o. MRN: [id number]  Chief Complaint  Patient presents with   Medical Management of Chronic Issues    Here for follow up for rybelsus. It started giving pt issues 1 month ago with diarrhea. Could not eat due to low appetite.  Quit taking it about 3 weeks ago.    HPI  Discussed the use of AI scribe software for clinical note transcription with the patient, who gave verbal consent to proceed.  History of Present Illness   Alexander Mccormick is a 48 year old male with diabetes who presents with gastrointestinal side effects from Rybelsus.  He began experiencing gastrointestinal issues approximately a month ago after starting Rybelsus. Initially, the medication was effective, but after the dosage was increased to 7 mg, he developed stomach upset, burping, and severe diarrhea. He discontinued Rybelsus, and after about a week and a half, the symptoms resolved completely.  He has a history of adverse reactions to diabetes medications, including metformin, which caused severe stomach pain leading to hospitalization. His current diabetes management includes pioglitazone, and he has been off Rybelsus for a couple of weeks. During this period, his blood sugar levels have been slightly elevated, with recent readings around 140 mg/dL, but previously as low as 108 mg/dL and as high as 629 mg/dL while on Rybelsus.  He recalls a previous medication similar to Jardiance that was discontinued due to ineffectiveness. He is currently taking pioglitazone as his sole diabetes medication.  He underwent a colonoscopy recently, during which three polyps were removed. He recalls being told by his wife that the polyps were 'pre cancer or something like that.' He has not experienced any concerning symptoms post-procedure and is awaiting further communication regarding the results.  No current gastrointestinal  symptoms after discontinuing Rybelsus. No urinary symptoms or skin issues in the genital area.      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    Objective:     BP 132/83   Pulse 72   Temp 97.7 F (36.5 C) (Oral)   Resp 16   Ht 5\' 10"  (1.778 m)   Wt 228 lb (103.4 kg)   SpO2 97%   BMI 32.71 kg/m  BP Readings from Last 3 Encounters:  02/22/24 132/83  02/15/24 116/70  12/31/23 132/88   Wt Readings from Last 3 Encounters:  02/22/24 228 lb (103.4 kg)  02/15/24 219 lb (99.3 kg)  02/05/24 219 lb (99.3 kg)      Physical Exam Constitutional:      General: He is not in acute distress.    Appearance: Normal appearance. He is not ill-appearing.  HENT:     Head: Normocephalic and atraumatic.     Right Ear: External ear normal.     Left Ear:  External ear normal.     Nose: Nose normal.  Eyes:     Conjunctiva/sclera: Conjunctivae normal.  Cardiovascular:     Rate and Rhythm: Normal rate and regular rhythm.     Pulses: Normal pulses.     Heart sounds: Normal heart sounds. No murmur heard.    No friction rub.  Pulmonary:     Effort: Pulmonary effort is normal. No respiratory distress.     Breath sounds: Normal breath sounds. No wheezing, rhonchi or rales.  Skin:    General: Skin is warm and dry.     Coloration: Skin is not jaundiced or pale.  Neurological:      Mental Status: He is alert.  Psychiatric:        Mood and Affect: Mood normal.        Behavior: Behavior normal.      No results found for any visits on 02/22/24.  Last CBC Lab Results  Component Value Date   WBC 5.9 11/27/2023   HGB 14.8 11/27/2023   HCT 44.0 11/27/2023   MCV 95 11/27/2023   MCH 32.0 11/27/2023   RDW 12.7 11/27/2023   PLT 367 11/27/2023   Last metabolic panel Lab Results  Component Value Date   GLUCOSE 114 (H) 11/27/2023   NA 139 11/27/2023   K 4.7 11/27/2023   CL 105 11/27/2023   CO2 26 11/27/2023   BUN 11 11/27/2023   CREATININE 1.06 11/27/2023   EGFR 87 11/27/2023   CALCIUM 9.5 11/27/2023   PROT 6.6 11/27/2023   ALBUMIN 4.1 11/27/2023   LABGLOB 2.5 11/27/2023   BILITOT 0.2 11/27/2023   ALKPHOS 91 11/27/2023   AST 18 11/27/2023   ALT 16 11/27/2023   ANIONGAP 9 08/10/2021   Last lipids Lab Results  Component Value Date   CHOL 131 11/27/2023   HDL 38 (L) 11/27/2023   LDLCALC 73 11/27/2023   TRIG 109 11/27/2023   CHOLHDL 3.4 11/27/2023   Last hemoglobin A1c Lab Results  Component Value Date   HGBA1C 6.2 (H) 09/30/2018      The ASCVD Risk score (Arnett DK, et al., 2019) failed to calculate for the following reasons:   Risk score cannot be calculated because patient has a medical history suggesting prior/existing ASCVD    Assessment & Plan:   Type 2 diabetes mellitus without complication, without long-term current use of insulin (HCC) Assessment & Plan: Chronic with side effects of treatment. Type 2 diabetes mellitus with recent medication intolerance. Blood glucose levels have been slightly elevated at approximately 140 mg/dL since discontinuation of Rybelsus. Previous intolerance to metformin and current use of pioglitazone. Considered Ozempic or Mounjaro for weight loss and glycemic control, noting potential gastrointestinal side effects similar to Rybelsus. Patient prefers ozempic over jardiance. Jardiance may increase risk of  urinary tract or genital infections due to glucose excretion. - Initiate ozempic weekly if approved - Order A1c test. - Consider jardiance if not approved  Orders: -     Microalbumin / creatinine urine ratio -     Hemoglobin A1c -     Ozempic (0.25 or 0.5 MG/DOSE); Inject 0.25 mg into the skin once a week.  Dispense: 3 mL; Refill: 4  Adverse effect of drug, initial encounter  Return if symptoms worsen or fail to improve.    Christel Bai T Khamiyah Grefe, PA-C

## 2024-02-22 NOTE — Assessment & Plan Note (Signed)
 Chronic with side effects of treatment. Type 2 diabetes mellitus with recent medication intolerance. Blood glucose levels have been slightly elevated at approximately 140 mg/dL since discontinuation of Rybelsus. Previous intolerance to metformin and current use of pioglitazone. Considered Ozempic or Mounjaro for weight loss and glycemic control, noting potential gastrointestinal side effects similar to Rybelsus. Patient prefers ozempic over jardiance. Jardiance may increase risk of urinary tract or genital infections due to glucose excretion. - Initiate ozempic weekly if approved - Order A1c test. - Consider jardiance if not approved

## 2024-02-23 LAB — HEMOGLOBIN A1C
Est. average glucose Bld gHb Est-mCnc: 148 mg/dL
Hgb A1c MFr Bld: 6.8 % — ABNORMAL HIGH (ref 4.8–5.6)

## 2024-02-23 LAB — MICROALBUMIN / CREATININE URINE RATIO
Creatinine, Urine: 61.4 mg/dL
Microalb/Creat Ratio: <5 mg/g{creat} (ref 0–29)
Microalbumin, Urine: <3 ug/mL

## 2024-02-25 ENCOUNTER — Ambulatory Visit (HOSPITAL_BASED_OUTPATIENT_CLINIC_OR_DEPARTMENT_OTHER): Payer: Self-pay | Admitting: Student

## 2024-03-04 ENCOUNTER — Other Ambulatory Visit (HOSPITAL_BASED_OUTPATIENT_CLINIC_OR_DEPARTMENT_OTHER): Payer: Self-pay

## 2024-03-17 ENCOUNTER — Ambulatory Visit: Payer: Self-pay | Admitting: Gastroenterology

## 2024-03-19 ENCOUNTER — Other Ambulatory Visit (HOSPITAL_BASED_OUTPATIENT_CLINIC_OR_DEPARTMENT_OTHER): Payer: Self-pay | Admitting: Student

## 2024-03-19 DIAGNOSIS — E119 Type 2 diabetes mellitus without complications: Secondary | ICD-10-CM

## 2024-03-19 MED ORDER — OZEMPIC (0.25 OR 0.5 MG/DOSE) 2 MG/3ML ~~LOC~~ SOPN
0.2500 mg | PEN_INJECTOR | SUBCUTANEOUS | 4 refills | Status: DC
Start: 2024-03-19 — End: 2024-05-19

## 2024-03-19 MED ORDER — OZEMPIC (0.25 OR 0.5 MG/DOSE) 2 MG/3ML ~~LOC~~ SOPN: 0.2500 mg | PEN_INJECTOR | SUBCUTANEOUS | 4 refills | Status: DC

## 2024-03-19 NOTE — Telephone Encounter (Signed)
 Copied from CRM [id number]. Topic: Clinical - Medication Refill >> Mar 19, 2024  3:27 PM Zipporah Him wrote: Medication: Semaglutide ,0.25 or 0.5MG /DOS, (OZEMPIC , 0.25 OR 0.5 MG/DOSE,) 2 MG/3ML SOPN  Has the patient contacted their pharmacy? Yes  This is the patient's preferred pharmacy:  Patient wants to get this through [facility], states a friend gets 40 say supply for $45.  Is this the correct pharmacy for this prescription? Yes If no, delete pharmacy and type the correct one.   Has the prescription been filled recently? Yes  Is the patient out of the medication? Never picked up, was too expensive   Has the patient been seen for an appointment in the last year OR does the patient have an upcoming appointment? Yes  Can we respond through MyChart? Yes  Agent: Please be advised that Rx refills may take up to 3 business days. We ask that you follow-up with your pharmacy.

## 2024-03-24 ENCOUNTER — Telehealth (HOSPITAL_BASED_OUTPATIENT_CLINIC_OR_DEPARTMENT_OTHER): Payer: Self-pay | Admitting: Student

## 2024-03-24 NOTE — Telephone Encounter (Unsigned)
 Copied from CRM [id number]. Topic: Appointments - Scheduling Inquiry for Clinic >> Mar 19, 2024  3:29 PM Zipporah Him wrote: Reason for CRM: Patient states his next appointment was to be a follow up with labs, states at last appointment labs were taken and also ne never got to start on the Semaglutide ,0.25 or 0.5MG /DOS, (OZEMPIC , 0.25 OR 0.5 MG/DOSE,) 2 MG/3ML SOPN as it was too expensive. His next appointment shows followup, he is Wondering if he still needs to keep this appointment, or reschedule to 3 months after he starts the shot. Please advise patient per his request.

## 2024-03-24 NOTE — Telephone Encounter (Signed)
**Note De-identified  [place] Obfuscation** Please advise 

## 2024-03-31 ENCOUNTER — Ambulatory Visit (HOSPITAL_BASED_OUTPATIENT_CLINIC_OR_DEPARTMENT_OTHER): Admitting: Student

## 2024-04-07 LAB — HM DIABETES EYE EXAM

## 2024-04-21 ENCOUNTER — Encounter (HOSPITAL_BASED_OUTPATIENT_CLINIC_OR_DEPARTMENT_OTHER): Payer: Self-pay | Admitting: Student

## 2024-04-21 ENCOUNTER — Encounter (HOSPITAL_BASED_OUTPATIENT_CLINIC_OR_DEPARTMENT_OTHER): Payer: Self-pay

## 2024-04-21 ENCOUNTER — Ambulatory Visit (INDEPENDENT_AMBULATORY_CARE_PROVIDER_SITE_OTHER): Admitting: Student

## 2024-04-21 VITALS — BP 159/96 | HR 71 | Temp 97.8°F | Resp 16 | Ht 70.0 in | Wt 235.0 lb

## 2024-04-21 DIAGNOSIS — H5712 Ocular pain, left eye: Secondary | ICD-10-CM | POA: Insufficient documentation

## 2024-04-21 DIAGNOSIS — S39012D Strain of muscle, fascia and tendon of lower back, subsequent encounter: Secondary | ICD-10-CM

## 2024-04-21 DIAGNOSIS — I10 Essential (primary) hypertension: Secondary | ICD-10-CM

## 2024-04-21 DIAGNOSIS — E782 Mixed hyperlipidemia: Secondary | ICD-10-CM

## 2024-04-21 DIAGNOSIS — E119 Type 2 diabetes mellitus without complications: Secondary | ICD-10-CM

## 2024-04-21 MED ORDER — LOSARTAN POTASSIUM 25 MG PO TABS: 25.0000 mg | ORAL_TABLET | Freq: Every day | ORAL | 5 refills | Status: DC

## 2024-04-21 MED ORDER — METHYLPREDNISOLONE 4 MG PO TBPK: ORAL_TABLET | ORAL | 0 refills | Status: DC

## 2024-04-21 NOTE — Assessment & Plan Note (Signed)
 Chronic, not at patient based subjective goal as he is gaining weight. Currently on Ozempic  for diabetes management and weight loss. Initiated at 0.5 mg, with plans to increase to 1 mg in a month to enhance weight loss. Reports decreased appetite and no nausea, a common side effect. Morning blood sugar is 142 mg/dL, slightly elevated but expected to improve with increased Ozempic  dose. A1c re-evaluation is not due at this time. - Increase Ozempic  to 1 mg in one month - Monitor blood sugar levels at home - Order follow-up cholesterol panel, blood count, liver, and kidney function tests

## 2024-04-21 NOTE — Assessment & Plan Note (Signed)
 Chronic, stable. On pravastatin  for cholesterol management with no muscle aches or joint pains. Follow-up cholesterol panel is due. - Continue pravastatin  - Order follow-up cholesterol panel

## 2024-04-21 NOTE — Assessment & Plan Note (Signed)
 Isolated issue. Experienced sharp pain behind the left eye, causing it to shut. Recent eye doctor evaluation noted a gland issue. Pain could be migraine-related, given migraine history. Eye appears red and slightly swollen. Warm compresses advised, and follow-up with eye doctor if pain recurs or worsens. - Advise warm compresses on the eye daily - Instruct to see the eye doctor if pain recurs or worsens

## 2024-04-21 NOTE — Assessment & Plan Note (Signed)
 continued issue. Persistent back pain, particularly when transitioning from sitting to standing. Likely muscular and positional, possibly due to repetitive strain from work activities. Previously used meloxicam , unsuitable for long-term use due to potential kidney effects. Consider physical therapy if pain persists beyond six weeks. Prednisone steroid pack offered as an alternative to meloxicam , with caution regarding potential increase in blood sugar levels. - Prescribe prednisone steroid pack - Advise on proper lifting techniques and posture - Consider referral to physical therapy if pain persists next month

## 2024-04-21 NOTE — Progress Notes (Signed)
 Established Patient Office Visit  Subjective   Patient ID: Alexander Mccormick, male    DOB: Jan 15, 1976  Age: 48 y.o. MRN: [id number]  Chief Complaint  Patient presents with   Medical Management of Chronic Issues    Follow up. Is out of meloxicam  and back kills him if he sits for a long time. Pain is on the same spot as before. Is on ozempic  0.5mg  now.     HPI  Discussed the use of AI scribe software for clinical note transcription with the patient, who gave verbal consent to proceed.  History of Present Illness   The patient presents for weight management and diabetes management follow-up.  He is currently on Ozempic  for weight management and diabetes control, starting with a dose of 0.5 mg. He experiences no nausea or adverse effects, notes a decrease in appetite, and an inability to finish meals as before. His recent morning blood sugar reading was 142 mg/dL. He is not due for an A1c test yet but is due for a follow-up cholesterol panel, blood count, and liver and kidney function tests.  He recently had an eye exam and is waiting for the records to be sent. He experienced a recent episode of sharp pain behind his left eye, causing his eye to shut and requiring his daughter to drive him home. He has a history of migraines and was previously on medication for them. His eye doctor mentioned a gland issue, and he is considering a procedure to unclog a tear duct.  He reports persistent back pain in one spot, which worsens after sitting and improves with movement. He previously used meloxicam  for inflammation but is currently out of it. He describes the pain as positional, worsening after sitting and improving with activity at work. No red flags noted.  He is on pravastatin  for cholesterol management and reports no muscle aches or joint pains. He does not monitor his blood pressure at home but recalls previous readings around 132/83 mmHg. He is not currently on any blood pressure medication.  No  nausea, muscle aches, joint pains, or leg swelling.      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 11/19/2023   Migraine 09/30/2018   Hyperlipemia 09/30/2018   Type 2 diabetes mellitus, without long-term current use of insulin (HCC) 05/02/2018   Past Medical History:  Diagnosis Date   Diabetes mellitus without complication (HCC)    Diabetic retinopathy (HCC)    Hypertension    Stroke (HCC) 09/2018   Stroke-like episode    Social History   Tobacco Use   Smoking status: Never    Passive exposure: Never   Smokeless tobacco: Never  Vaping Use   Vaping status: Never Used  Substance Use Topics   Alcohol use: Not Currently   Drug use: Not Currently   Allergies  Allergen Reactions   Sulfamethoxazole-Trimethoprim Rash      ROS Per HPI.    Objective:     BP (!) 159/96   Pulse 71   Temp 97.8 F (36.6 C) (Oral)   Resp 16   Ht 5' 10 (1.778 m)   Wt 235 lb (106.6 kg)   SpO2 98%   BMI 33.72 kg/m  BP Readings from Last 3 Encounters:  04/21/24 (!) 159/96  02/22/24 132/83  02/15/24  116/70   Wt Readings from Last 3 Encounters:  04/21/24 235 lb (106.6 kg)  02/22/24 228 lb (103.4 kg)  02/15/24 219 lb (99.3 kg)      Physical Exam Constitutional:      General: He is not in acute distress.    Appearance: Normal appearance. He is not ill-appearing.  HENT:     Head: Normocephalic and atraumatic.     Right Ear: External ear normal.     Left Ear: External ear normal.     Nose: Nose normal.  Eyes:     Conjunctiva/sclera: Conjunctivae normal.  Cardiovascular:     Rate and Rhythm: Normal rate and regular rhythm.     Heart sounds: Normal heart sounds. No murmur heard.    No friction rub.  Pulmonary:     Effort: Pulmonary effort is normal. No respiratory  distress.     Breath sounds: Normal breath sounds. No wheezing, rhonchi or rales.  Musculoskeletal:        General: Tenderness present.     Comments: Tenderness to palpation of R sided paraspinous musculature. No true CVA tenderness.  Skin:    General: Skin is warm and dry.     Coloration: Skin is not jaundiced or pale.  Neurological:     Mental Status: He is alert.  Psychiatric:        Mood and Affect: Mood normal.        Behavior: Behavior normal.      Results for orders placed or performed in visit on 04/21/24  HM DIABETES EYE EXAM  Result Value Ref Range   HM Diabetic Eye Exam Retinopathy (A) No Retinopathy    Last CBC Lab Results  Component Value Date   WBC 5.9 11/27/2023   HGB 14.8 11/27/2023   HCT 44.0 11/27/2023   MCV 95 11/27/2023   MCH 32.0 11/27/2023   RDW 12.7 11/27/2023   PLT 367 11/27/2023   Last metabolic panel Lab Results  Component Value Date   GLUCOSE 114 (H) 11/27/2023   NA 139 11/27/2023   K 4.7 11/27/2023   CL 105 11/27/2023   CO2 26 11/27/2023   BUN 11 11/27/2023   CREATININE 1.06 11/27/2023   EGFR 87 11/27/2023   CALCIUM 9.5 11/27/2023   PROT 6.6 11/27/2023   ALBUMIN 4.1 11/27/2023   LABGLOB 2.5 11/27/2023   BILITOT 0.2 11/27/2023   ALKPHOS 91 11/27/2023   AST 18 11/27/2023   ALT 16 11/27/2023   ANIONGAP 9 08/10/2021   Last lipids Lab Results  Component Value Date   CHOL 131 11/27/2023   HDL 38 (L) 11/27/2023   LDLCALC 73 11/27/2023   TRIG 109 11/27/2023   CHOLHDL 3.4 11/27/2023   Last hemoglobin A1c Lab Results  Component Value Date   HGBA1C 6.8 (H) 02/22/2024      The ASCVD Risk score (Arnett DK, et al., 2019) failed to calculate for the following reasons:   Risk score cannot be calculated because patient has a medical history suggesting prior/existing ASCVD    Assessment & Plan:   Mixed hyperlipidemia Assessment & Plan: Chronic, stable. On pravastatin  for cholesterol management with no muscle aches or joint  pains. Follow-up cholesterol panel is due. - Continue pravastatin  - Order follow-up cholesterol panel  Orders: -     Lipid panel  Type 2 diabetes mellitus without complication, without long-term current use of insulin (HCC) Assessment & Plan: Chronic, not at patient based subjective goal as he is gaining weight. Currently on Ozempic  for diabetes  management and weight loss. Initiated at 0.5 mg, with plans to increase to 1 mg in a month to enhance weight loss. Reports decreased appetite and no nausea, a common side effect. Morning blood sugar is 142 mg/dL, slightly elevated but expected to improve with increased Ozempic  dose. A1c re-evaluation is not due at this time. - Increase Ozempic  to 1 mg in one month - Monitor blood sugar levels at home - Order follow-up cholesterol panel, blood count, liver, and kidney function tests  Orders: -     CBC with Differential/Platelet -     Comprehensive metabolic panel with GFR  Primary hypertension Assessment & Plan: Chronic, not at goal. Blood pressure readings are slightly elevated at 132/83 mmHg. Not on antihypertensive medication. Starting losartan  is suggested for its renal protective effects in diabetes. Recommend a low dose of 25 mg to minimize side effects. - Prescribe losartan  25 mg daily  Orders: -     CBC with Differential/Platelet -     Comprehensive metabolic panel with GFR -     Losartan  Potassium; Take 1 tablet (25 mg total) by mouth daily.  Dispense: 30 tablet; Refill: 5  Strain of lumbar region, subsequent encounter Assessment & Plan: continued issue. Persistent back pain, particularly when transitioning from sitting to standing. Likely muscular and positional, possibly due to repetitive strain from work activities. Previously used meloxicam , unsuitable for long-term use due to potential kidney effects. Consider physical therapy if pain persists beyond six weeks. Prednisone steroid pack offered as an alternative to meloxicam , with  caution regarding potential increase in blood sugar levels. - Prescribe prednisone steroid pack - Advise on proper lifting techniques and posture - Consider referral to physical therapy if pain persists next month  Orders: -     methylPREDNISolone ; Use as directed.  Dispense: 21 each; Refill: 0  Left eye pain Assessment & Plan: Isolated issue. Experienced sharp pain behind the left eye, causing it to shut. Recent eye doctor evaluation noted a gland issue. Pain could be migraine-related, given migraine history. Eye appears red and slightly swollen. Warm compresses advised, and follow-up with eye doctor if pain recurs or worsens. - Advise warm compresses on the eye daily - Instruct to see the eye doctor if pain recurs or worsens   General Health Maintenance Recent eye exam results pending. Surgical pathology from colonoscopy showed no high-grade dysplasia or cancer. Follow-up interval for colonoscopy is yet to be determined, with a preference for a ten-year interval if possible. - Obtain records from recent eye exam - Determine follow-up interval for colonoscopy  Follow-up Scheduled for a follow-up visit in one month to reassess conditions and treatment efficacy. - Schedule follow-up visit in one month      Return in about 4 weeks (around 05/19/2024) for ozempic  titration.    Dann Ventress T Tally Mattox, PA-C

## 2024-04-21 NOTE — Assessment & Plan Note (Signed)
 Chronic, not at goal. Blood pressure readings are slightly elevated at 132/83 mmHg. Not on antihypertensive medication. Starting losartan  is suggested for its renal protective effects in diabetes. Recommend a low dose of 25 mg to minimize side effects. - Prescribe losartan  25 mg daily

## 2024-04-22 ENCOUNTER — Ambulatory Visit (HOSPITAL_BASED_OUTPATIENT_CLINIC_OR_DEPARTMENT_OTHER): Payer: Self-pay | Admitting: Student

## 2024-04-22 LAB — COMPREHENSIVE METABOLIC PANEL WITH GFR
ALT: 14 IU/L (ref 0–44)
AST: 21 IU/L (ref 0–40)
Albumin: 4.1 g/dL (ref 4.1–5.1)
Alkaline Phosphatase: 88 IU/L (ref 44–121)
BUN/Creatinine Ratio: 14 (ref 9–20)
BUN: 13 mg/dL (ref 6–24)
Bilirubin Total: 0.2 mg/dL (ref 0.0–1.2)
CO2: 21 mmol/L (ref 20–29)
Calcium: 9.1 mg/dL (ref 8.7–10.2)
Chloride: 102 mmol/L (ref 96–106)
Creatinine, Ser: 0.94 mg/dL (ref 0.76–1.27)
Globulin, Total: 2.5 g/dL (ref 1.5–4.5)
Glucose: 148 mg/dL — ABNORMAL HIGH (ref 70–99)
Potassium: 4.8 mmol/L (ref 3.5–5.2)
Sodium: 137 mmol/L (ref 134–144)
Total Protein: 6.6 g/dL (ref 6.0–8.5)
eGFR: 101 mL/min/1.73 (ref >59)

## 2024-04-22 LAB — CBC WITH DIFFERENTIAL/PLATELET
Basophils Absolute: 0 x10E3/uL (ref 0.0–0.2)
Basos: 0 %
EOS (ABSOLUTE): 0.1 x10E3/uL (ref 0.0–0.4)
Eos: 2 %
Hematocrit: 45.8 % (ref 37.5–51.0)
Hemoglobin: 14.9 g/dL (ref 13.0–17.7)
Immature Grans (Abs): 0 x10E3/uL (ref 0.0–0.1)
Immature Granulocytes: 0 %
Lymphocytes Absolute: 1.9 x10E3/uL (ref 0.7–3.1)
Lymphs: 32 %
MCH: 31.4 pg (ref 26.6–33.0)
MCHC: 32.5 g/dL (ref 31.5–35.7)
MCV: 97 fL (ref 79–97)
Monocytes Absolute: 0.4 x10E3/uL (ref 0.1–0.9)
Monocytes: 7 %
Neutrophils Absolute: 3.6 x10E3/uL (ref 1.4–7.0)
Neutrophils: 59 %
Platelets: 332 x10E3/uL (ref 150–450)
RBC: 4.74 x10E6/uL (ref 4.14–5.80)
RDW: 12.5 % (ref 11.6–15.4)
WBC: 6 x10E3/uL (ref 3.4–10.8)

## 2024-04-22 LAB — LIPID PANEL
Chol/HDL Ratio: 4.2 ratio (ref 0.0–5.0)
Cholesterol, Total: 159 mg/dL (ref 100–199)
HDL: 38 mg/dL — ABNORMAL LOW (ref >39)
LDL Chol Calc (NIH): 97 mg/dL (ref 0–99)
Triglycerides: 133 mg/dL (ref 0–149)
VLDL Cholesterol Cal: 24 mg/dL (ref 5–40)

## 2024-05-19 ENCOUNTER — Encounter (HOSPITAL_BASED_OUTPATIENT_CLINIC_OR_DEPARTMENT_OTHER): Payer: Self-pay | Admitting: Student

## 2024-05-19 ENCOUNTER — Ambulatory Visit (INDEPENDENT_AMBULATORY_CARE_PROVIDER_SITE_OTHER): Admitting: Student

## 2024-05-19 VITALS — BP 131/88 | HR 82 | Temp 98.2°F | Resp 16 | Ht 70.0 in | Wt 236.9 lb

## 2024-05-19 DIAGNOSIS — E119 Type 2 diabetes mellitus without complications: Secondary | ICD-10-CM

## 2024-05-19 DIAGNOSIS — E782 Mixed hyperlipidemia: Secondary | ICD-10-CM

## 2024-05-19 DIAGNOSIS — I10 Essential (primary) hypertension: Secondary | ICD-10-CM

## 2024-05-19 MED ORDER — SEMAGLUTIDE (1 MG/DOSE) 4 MG/3ML ~~LOC~~ SOPN: 1.0000 mg | PEN_INJECTOR | SUBCUTANEOUS | 3 refills | Status: DC

## 2024-05-19 NOTE — Patient Instructions (Signed)
 It was nice to see you today!  If you have any problems before your next visit feel free to message me via MyChart (minor issues or questions) or call the office, otherwise you may reach out to schedule an office visit.  Thank you! Pau Banh, PA-C

## 2024-05-19 NOTE — Progress Notes (Signed)
 Established Patient Office Visit  Subjective   Patient ID: Alexander Mccormick, male    DOB: June 11, 1976  Age: 48 y.o. MRN: [id number]  Chief Complaint  Patient presents with   Medical Management of Chronic Issues    Follow up. Wondering if maybe BP was high due to back pain?     HPI  Discussed the use of AI scribe software for clinical note transcription with the patient, who gave verbal consent to proceed.  History of Present Illness   Alexander Mccormick is a 48 year old male with hypertension and diabetes who presents for blood pressure management and diabetes follow-up.  He is concerned about his blood pressure, which has been variable, sometimes normal and sometimes elevated. He is currently on losartan  25 mg, and his recent reading was 129/83 mmHg. He has not been monitoring his blood pressure at home. No side effects from losartan  have been experienced.  He has a history of diabetes and is currently on Ozempic , having recently increased his dose to 0.5 mg. He has been on this dose for two weeks and has experienced some gastrointestinal symptoms, such as diarrhea, which resolved after a couple of days. His blood sugar was 121 mg/dL recently.  His weight has been stable, currently at 236 lbs, with a previous weight of 235 lbs. He is working on weight loss, as he believes this may help with his blood pressure.  He experiences back pain, particularly after sitting for long periods, which improves with movement. 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 A previous course of steroids was effective in managing his back pain, although it temporarily increased his blood sugar levels.  No nausea with Ozempic , muscle or joint pains with pravastatin , or issues with Viagra . He does not own a blood pressure cuff at home but is considering purchasing one for better monitoring.  No fatigue, nausea, muscle or joint pains, or issues with his current medications.       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 11/19/2023   Migraine 09/30/2018   Hyperlipemia 09/30/2018   Type 2 diabetes mellitus, without long-term current use of insulin (HCC) 05/02/2018   Past Medical History:  Diagnosis Date   Diabetes mellitus without complication (HCC)    Diabetic retinopathy (HCC)    Hypertension    Stroke (HCC) 09/2018   Stroke-like episode    Social History   Tobacco Use   Smoking status: Never    Passive exposure: Never   Smokeless tobacco: Never  Vaping Use   Vaping status: Never Used  Substance Use Topics   Alcohol use: Not Currently   Drug use: Not Currently   Allergies  Allergen Reactions   Sulfamethoxazole-Trimethoprim Rash      ROS Per HPI.    Objective:     BP 131/88   Pulse 82   Temp 98.2 F (36.8 C) (Oral)   Resp 16   Ht 5' 10 (1.778 m)   Wt 236 lb 14.4 oz (107.5 kg)   SpO2 97%   BMI 33.99 kg/m  BP Readings from Last 3 Encounters:  05/19/24 131/88  04/21/24 (!) 159/96  02/22/24 132/83   Wt Readings from Last 3 Encounters:  05/19/24 236 lb 14.4 oz (107.5 kg)  04/21/24 235 lb (106.6  kg)  02/22/24 228 lb (103.4 kg)      Physical Exam Constitutional:      General: He is not in acute distress.    Appearance: Normal appearance. He is not ill-appearing.  HENT:     Head: Normocephalic and atraumatic.     Right Ear: External ear normal.     Left Ear: External ear normal.     Nose: Nose normal.  Eyes:     Conjunctiva/sclera: Conjunctivae normal.  Cardiovascular:     Rate and Rhythm: Normal rate and regular rhythm.     Pulses: Normal pulses.     Heart sounds: Normal heart sounds. No murmur heard.    No friction rub.  Pulmonary:     Effort: Pulmonary effort is normal. No respiratory distress.     Breath sounds: Normal breath  sounds. No wheezing, rhonchi or rales.  Skin:    General: Skin is warm and dry.     Coloration: Skin is not jaundiced or pale.  Neurological:     Mental Status: He is alert.  Psychiatric:        Mood and Affect: Mood normal.        Behavior: Behavior normal.      No results found for any visits on 05/19/24.  Last CBC Lab Results  Component Value Date   WBC 6.0 04/21/2024   HGB 14.9 04/21/2024   HCT 45.8 04/21/2024   MCV 97 04/21/2024   MCH 31.4 04/21/2024   RDW 12.5 04/21/2024   PLT 332 04/21/2024   Last metabolic panel Lab Results  Component Value Date   GLUCOSE 148 (H) 04/21/2024   NA 137 04/21/2024   K 4.8 04/21/2024   CL 102 04/21/2024   CO2 21 04/21/2024   BUN 13 04/21/2024   CREATININE 0.94 04/21/2024   EGFR 101 04/21/2024   CALCIUM 9.1 04/21/2024   PROT 6.6 04/21/2024   ALBUMIN 4.1 04/21/2024   LABGLOB 2.5 04/21/2024   BILITOT 0.2 04/21/2024   ALKPHOS 88 04/21/2024   AST 21 04/21/2024   ALT 14 04/21/2024   ANIONGAP 9 08/10/2021   Last lipids Lab Results  Component Value Date   CHOL 159 04/21/2024   HDL 38 (L) 04/21/2024   LDLCALC 97 04/21/2024   TRIG 133 04/21/2024   CHOLHDL 4.2 04/21/2024   Last hemoglobin A1c Lab Results  Component Value Date   HGBA1C 6.8 (H) 02/22/2024      The ASCVD Risk score (Arnett DK, et al., 2019) failed to calculate for the following reasons:   Risk score cannot be calculated because patient has a medical history suggesting prior/existing ASCVD    Assessment & Plan:   Assessment and Plan    Type 2 diabetes mellitus Chronic, not at goal- titrating upwards. Transitioning from 0.5 mg to 1 mg, expecting significant weight loss. No nausea or other side effects. Last A1c checked two months ago, next due on May 24, 2024. - Continue Ozempic, increase to 1 mg - Order A1c test at next visit  Essential hypertension Chronic, not at goal. Blood pressure slightly elevated at 129/83 mmHg, close to target of <130/80  mmHg, important due to diabetes. On losartan 25 mg, may not be sufficient. No adverse effects. Prefers to maintain current dose until next visit to assess trends. - Continue losartan 25 mg - Purchase a home blood pressure cuff and monitor blood pressure regularly - Bring home blood pressure cuff to next visit for calibration check - Consider increasing losartan dose if blood  pressure remains elevated at next visit  Obesity Chronic- BMI 33.99 - not at goal. Working on weight loss to improve blood pressure and diabetes management. Significant weight loss anticipated with Ozempic  increase to 1 mg. - Continue weight management efforts  Chronic low back pain Persists, especially after prolonged sitting, improves with movement. Recent steroid pack effective.  General Health Maintenance Discussion about cost-effectiveness of mail-order pharmacy for medications. - Consider transferring medications to mail-order pharmacy for cost savings      Return in about 8 weeks (around 07/14/2024) for DM.    Zuhair Lariccia T Jeanclaude Wentworth, PA-C

## 2024-06-26 ENCOUNTER — Telehealth (HOSPITAL_BASED_OUTPATIENT_CLINIC_OR_DEPARTMENT_OTHER): Payer: Self-pay

## 2024-06-26 NOTE — Telephone Encounter (Signed)
 Copied from CRM [id number]. Topic: Clinical - Medication Question >> Jun 25, 2024  4:36 PM Lauren C wrote: Reason for CRM: Pt calling regarding a medication that he had gotten previously for back pain (methylPREDNISolone  4 MG). He does not have an appointment for a wihle with ortho so was wondering if he could try this steroid medication again until then. Declined triage for back pain red word. He still prefers: [facility] - [place], [place] - [address] [place] [address] Phone: [phone] Fax: [phone]

## 2024-07-14 ENCOUNTER — Ambulatory Visit (HOSPITAL_BASED_OUTPATIENT_CLINIC_OR_DEPARTMENT_OTHER): Admitting: Student

## 2024-07-21 ENCOUNTER — Ambulatory Visit (HOSPITAL_BASED_OUTPATIENT_CLINIC_OR_DEPARTMENT_OTHER): Admitting: Student

## 2024-07-28 ENCOUNTER — Ambulatory Visit (HOSPITAL_BASED_OUTPATIENT_CLINIC_OR_DEPARTMENT_OTHER): Admitting: Student

## 2024-09-12 ENCOUNTER — Encounter (HOSPITAL_BASED_OUTPATIENT_CLINIC_OR_DEPARTMENT_OTHER): Payer: Self-pay | Admitting: Student

## 2024-09-12 ENCOUNTER — Ambulatory Visit (INDEPENDENT_AMBULATORY_CARE_PROVIDER_SITE_OTHER): Admitting: Student

## 2024-09-12 VITALS — BP 145/83 | HR 79 | Temp 98.0°F | Resp 16 | Wt 245.2 lb

## 2024-09-12 DIAGNOSIS — E119 Type 2 diabetes mellitus without complications: Secondary | ICD-10-CM

## 2024-09-12 DIAGNOSIS — R413 Other amnesia: Secondary | ICD-10-CM | POA: Diagnosis not present

## 2024-09-12 DIAGNOSIS — E782 Mixed hyperlipidemia: Secondary | ICD-10-CM | POA: Diagnosis not present

## 2024-09-12 DIAGNOSIS — I10 Essential (primary) hypertension: Secondary | ICD-10-CM

## 2024-09-12 MED ORDER — TIRZEPATIDE 2.5 MG/0.5ML ~~LOC~~ SOAJ: 2.5000 mg | SUBCUTANEOUS | 0 refills | Status: DC

## 2024-09-12 MED ORDER — LOSARTAN POTASSIUM 50 MG PO TABS: 50.0000 mg | ORAL_TABLET | Freq: Every day | ORAL | 5 refills | Status: AC

## 2024-09-12 NOTE — Progress Notes (Signed)
 Established Patient Office Visit  Subjective   Patient ID: Alexander Mccormick, male    DOB: [birth year]  Age: 48 y.o. MRN: [id number]  Chief Complaint  Patient presents with   Follow-up    Pt. Here today for a follow-up on DM. Pt. Denies pain at this time.     HPI  Discussed the use of AI scribe software for clinical note transcription with the patient, who gave verbal consent to proceed.  History of Present Illness   Alexander Mccormick is a 49 year old male with type 2 diabetes and hypertension who presents for medication management and memory concerns.  He has type 2 diabetes and was previously on Ozempic , which was increased to 1 mg daily. However, due to issues with his mail order pharmacy, he has not been taking the medication since August. He is interested in weight loss medications and is considering alternatives due to the challenges with Ozempic .  His blood pressure was 139/80 mmHg this morning. He is currently on 25 mg of losartan  and has not been monitoring his blood pressure at home due to lack of equipment.  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 He reports that he does not recall any worsening of his memory. He can remember long-term events but struggles with short-term memory tasks, such as remembering recent conversations or errands. He has a family history of Alzheimer's disease on his maternal side, with his grandfather diagnosed in his eighties.  He is currently taking pravastatin  and Actos, and he reports no issues with these medications. He is also on losartan  25 mg.  He has a history of a learning disability diagnosed in childhood, which may affect his memory test results. He completed high school and manages finances without difficulty. He occasionally gets disoriented while driving in unfamiliar areas but has no issues with familiar routes. 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  No chest pain or shortness of breath. Reports short-term memory issues since stroke in 2019.      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 11/19/2023   Migraine 09/30/2018   Hyperlipemia 09/30/2018   Type 2 diabetes mellitus, without long-term current use of insulin (HCC) 05/02/2018   Past Medical History:  Diagnosis Date   Diabetes mellitus without complication (HCC)    Diabetic retinopathy (HCC)    Hypertension    Stroke (HCC) 09/2018   Stroke-like episode    Social History   Tobacco Use   Smoking status: Never    Passive exposure: Never   Smokeless tobacco: Never  Vaping Use   Vaping status: Never Used  Substance Use Topics   Alcohol use: Not Currently   Drug use: Not Currently   Allergies  Allergen Reactions   Sulfamethoxazole-Trimethoprim Rash      ROS Per HPI.    Objective:     BP (!) 145/83   Pulse 79   Temp 98 F (36.7 C) (Oral)   Resp 16   Wt 245 lb 3.2 oz (111.2 kg)   SpO2 95%   BMI 35.18 kg/m  BP Readings from Last 3 Encounters:  09/12/24 (!) 145/83  05/19/24 131/88  04/21/24 (!) 159/96  Wt Readings from Last 3 Encounters:  09/12/24 245 lb 3.2 oz (111.2 kg)  05/19/24 236 lb 14.4 oz (107.5 kg)  04/21/24 235 lb (106.6 kg)   SpO2 Readings from Last 3 Encounters:  09/12/24 95%  05/19/24 97%  04/21/24 98%   Physical Exam Constitutional:      General: He is not in acute distress.    Appearance: Normal appearance. He is not ill-appearing.  HENT:     Head: Normocephalic and atraumatic.     Right Ear: External ear normal.     Left Ear: External ear normal.     Nose: Nose normal.  Eyes:     Conjunctiva/sclera: Conjunctivae normal.  Cardiovascular:     Rate and Rhythm: Normal rate  and regular rhythm.     Pulses: Normal pulses.     Heart sounds: Normal heart sounds. No murmur heard.    No friction rub.  Pulmonary:     Effort: Pulmonary effort is normal. No respiratory distress.     Breath sounds: Normal breath sounds. No wheezing, rhonchi or rales.  Skin:    General: Skin is warm and dry.     Coloration: Skin is not jaundiced or pale.  Neurological:     Mental Status: He is alert.  Psychiatric:        Mood and Affect: Mood normal.        Behavior: Behavior normal.      No results found for any visits on 09/12/24.  Last CBC Lab Results  Component Value Date   WBC 6.0 04/21/2024   HGB 14.9 04/21/2024   HCT 45.8 04/21/2024   MCV 97 04/21/2024   MCH 31.4 04/21/2024   RDW 12.5 04/21/2024   PLT 332 04/21/2024   Last metabolic panel Lab Results  Component Value Date   GLUCOSE 148 (H) 04/21/2024   NA 137 04/21/2024   K 4.8 04/21/2024   CL 102 04/21/2024   CO2 21 04/21/2024   BUN 13 04/21/2024   CREATININE 0.94 04/21/2024   EGFR 101 04/21/2024   CALCIUM 9.1 04/21/2024   PROT 6.6 04/21/2024   ALBUMIN 4.1 04/21/2024   LABGLOB 2.5 04/21/2024   BILITOT 0.2 04/21/2024   ALKPHOS 88 04/21/2024   AST 21 04/21/2024   ALT 14 04/21/2024   ANIONGAP 9 08/10/2021   Last lipids Lab Results  Component Value Date   CHOL 159 04/21/2024   HDL 38 (L) 04/21/2024   LDLCALC 97 04/21/2024   TRIG 133 04/21/2024   CHOLHDL 4.2 04/21/2024   Last hemoglobin A1c Lab Results  Component Value Date   HGBA1C 6.8 (H) 02/22/2024      The ASCVD Risk score (Arnett DK, et al., 2019) failed to calculate for the following reasons:   Risk score cannot be calculated because patient has a medical history suggesting prior/existing ASCVD    Assessment & Plan:   Assessment and Plan    Type 2 diabetes mellitus/ Morbid obesity with BMI 35 plus t2dm Managed with Ozempic , but issues with mail order pharmacy led to discontinuation. Considering switch to Mounjaro  for weight  loss benefits. Mounjaro  has more weight loss potential but may be harder to get approved by insurance. Average weight loss with Mounjaro  is 5-10 pounds per year over Ozempic , with some patients losing hundreds of pounds. - Submitted prior authorization for Mounjaro . - Will schedule follow-up in one month to assess Mounjaro  titration. - Ordered A1c test. - Ordered CMP to check liver, kidneys, and electrolytes. - Ordered cholesterol test. -  Work on diet and exercise as below  Primary hypertension Blood pressure readings slightly above target range. Current medication is losartan  25 mg. Discussed importance of maintaining blood pressure below 130/80 mmHg due to increased risk of stroke and heart attack associated with type 2 diabetes. - Increased losartan  to 50 mg daily. - Provided blood pressure cuff for home monitoring. - Instructed to record blood pressure twice daily. - Rechecked blood pressure before leaving the clinic.  Mixed hyperlipidemia Managed with pravastatin . No issues reported with current medication. - Continue pravastatin  as prescribed.  Memory loss Score of 23 on cognitive testing, slightly below typical range- possible mild cognitive impairment but history of learning disability is likely affecting the accuracy of results. Discussed potential factors affecting memory, including stroke, learning disability, and lifestyle factors. Emphasized importance of diet and exercise in protecting memory. - Will plan to repeat cognitive testing in six months. - Recommended MIND diet for memory protection. - Encouraged regular exercise, including walking and weightlifting. - Ordered thyroid function tests, B12,  and folate levels.  - PHQ9 score of 1     Return in about 4 weeks (around 10/10/2024) for mounjaro  start.  Tessi Eustache T Iyah Laguna, PA-C

## 2024-09-12 NOTE — Patient Instructions (Addendum)
 It was nice to see you today!  I would encourage you to look into the MIND diet or the mediterranean diet.  Please make sure to occupy your mind with things like puzzles, books, or crossword puzzles.  I would encourage you to aim for 10,000 steps and incorporate some fort of resistance training such as weight lifting.  If you have any problems before your next visit feel free to message me via MyChart (minor issues or questions) or call the office, otherwise you may reach out to schedule an office visit.  Thank you! Anayiah Howden, PA-C

## 2024-09-13 LAB — LIPID PANEL
Chol/HDL Ratio: 4.2 ratio (ref 0.0–5.0)
Cholesterol, Total: 202 mg/dL — ABNORMAL HIGH (ref 100–199)
HDL: 48 mg/dL (ref >39)
LDL Chol Calc (NIH): 128 mg/dL — ABNORMAL HIGH (ref 0–99)
Triglycerides: 143 mg/dL (ref 0–149)
VLDL Cholesterol Cal: 26 mg/dL (ref 5–40)

## 2024-09-13 LAB — COMPREHENSIVE METABOLIC PANEL WITH GFR
ALT: 20 IU/L (ref 0–44)
AST: 19 IU/L (ref 0–40)
Albumin: 4.5 g/dL (ref 4.1–5.1)
Alkaline Phosphatase: 102 IU/L (ref 47–123)
BUN/Creatinine Ratio: 12 (ref 9–20)
BUN: 13 mg/dL (ref 6–24)
Bilirubin Total: 0.3 mg/dL (ref 0.0–1.2)
CO2: 23 mmol/L (ref 20–29)
Calcium: 9.5 mg/dL (ref 8.7–10.2)
Chloride: 99 mmol/L (ref 96–106)
Creatinine, Ser: 1.09 mg/dL (ref 0.76–1.27)
Globulin, Total: 2.6 g/dL (ref 1.5–4.5)
Glucose: 182 mg/dL — ABNORMAL HIGH (ref 70–99)
Potassium: 4.6 mmol/L (ref 3.5–5.2)
Sodium: 139 mmol/L (ref 134–144)
Total Protein: 7.1 g/dL (ref 6.0–8.5)
eGFR: 84 mL/min/1.73 (ref >59)

## 2024-09-13 LAB — B12 AND FOLATE PANEL
Folate: >20 ng/mL (ref >3.0)
Vitamin B-12: 655 pg/mL (ref 232–1245)

## 2024-09-13 LAB — HEMOGLOBIN A1C
Est. average glucose Bld gHb Est-mCnc: 177 mg/dL
Hgb A1c MFr Bld: 7.8 % — ABNORMAL HIGH (ref 4.8–5.6)

## 2024-09-13 LAB — TSH: TSH: 1.16 u[IU]/mL (ref 0.450–4.500)

## 2024-09-15 ENCOUNTER — Telehealth (HOSPITAL_BASED_OUTPATIENT_CLINIC_OR_DEPARTMENT_OTHER): Payer: Self-pay

## 2024-09-15 ENCOUNTER — Ambulatory Visit (HOSPITAL_BASED_OUTPATIENT_CLINIC_OR_DEPARTMENT_OTHER): Payer: Self-pay | Admitting: Student

## 2024-09-15 ENCOUNTER — Other Ambulatory Visit (HOSPITAL_COMMUNITY): Payer: Self-pay

## 2024-09-15 NOTE — Telephone Encounter (Signed)
 Staff message sent to Rx PA team for mounjaro .

## 2024-09-22 ENCOUNTER — Other Ambulatory Visit (HOSPITAL_COMMUNITY): Payer: Self-pay

## 2024-09-22 ENCOUNTER — Telehealth (HOSPITAL_BASED_OUTPATIENT_CLINIC_OR_DEPARTMENT_OTHER): Payer: Self-pay | Admitting: Student

## 2024-09-22 ENCOUNTER — Telehealth (HOSPITAL_BASED_OUTPATIENT_CLINIC_OR_DEPARTMENT_OTHER): Payer: Self-pay

## 2024-09-22 NOTE — Telephone Encounter (Signed)
 Pharmacy Patient Advocate Encounter   Received notification from Physician's Office that prior authorization for Mounjaro  2.5 mg/0.5 ml is required/requested.   Insurance verification completed.   The patient is insured through [employer].   Per test claim: PA required; PA submitted to above mentioned insurance via Fax Key/confirmation #/EOC not on form Status is pending

## 2024-09-22 NOTE — Telephone Encounter (Signed)
 Copied from CRM #[id number]. Topic: Clinical - Medication Prior Auth >> Sep 19, 2024  2:01 PM Fonda T wrote: Reason for CRM: Pt calling to check status of prior authorization for medication, Mounjaro .  Per pt called insurance company this morning and was advised, that no PA has been received from office regarding medication.  Pt is requesting a return call with an explanation as he stets he was advised on 09/15/24 that PA was submitted to insurance, per Elliott.  Pt can be reached back at [phone], to discuss further.  Pt is aware of same day call back.

## 2024-09-23 ENCOUNTER — Other Ambulatory Visit (HOSPITAL_COMMUNITY): Payer: Self-pay

## 2024-09-23 NOTE — Telephone Encounter (Signed)
 Pharmacy Patient Advocate Encounter  Received notification from  [facility]  that Prior Authorization for Mounjaro  2.5 mg/0.5 ml  has been APPROVED from 09/22/2024 to 10/03/2025. Ran test claim, Copay is $166.01. This test claim was processed through [facility]- copay amounts may vary at other pharmacies due to pharmacy/plan contracts, or as the patient moves through the different stages of their insurance plan.

## 2024-10-10 ENCOUNTER — Ambulatory Visit (HOSPITAL_BASED_OUTPATIENT_CLINIC_OR_DEPARTMENT_OTHER): Admitting: Student

## 2024-10-13 ENCOUNTER — Ambulatory Visit (HOSPITAL_BASED_OUTPATIENT_CLINIC_OR_DEPARTMENT_OTHER): Admitting: Student

## 2024-10-13 ENCOUNTER — Encounter (HOSPITAL_BASED_OUTPATIENT_CLINIC_OR_DEPARTMENT_OTHER): Payer: Self-pay | Admitting: Student

## 2024-10-13 VITALS — BP 124/87 | HR 98 | Temp 97.9°F | Resp 16 | Ht 70.0 in | Wt 242.6 lb

## 2024-10-13 DIAGNOSIS — E119 Type 2 diabetes mellitus without complications: Secondary | ICD-10-CM

## 2024-10-13 DIAGNOSIS — I10 Essential (primary) hypertension: Secondary | ICD-10-CM | POA: Diagnosis not present

## 2024-10-13 MED ORDER — TIRZEPATIDE 5 MG/0.5ML ~~LOC~~ SOAJ: 5.0000 mg | SUBCUTANEOUS | 2 refills | Status: AC

## 2024-10-13 NOTE — Patient Instructions (Signed)
 It was nice to see you today!  If you have any problems before your next visit feel free to message me via MyChart (minor issues or questions) or call the office, otherwise you may reach out to schedule an office visit.  Thank you! Pau Banh, PA-C

## 2024-10-13 NOTE — Progress Notes (Signed)
 "  Established Patient Office Visit  Subjective   Patient ID: Alexander Mccormick, male    DOB: 1975/12/03  Age: 49 y.o. MRN: [id number]  Chief Complaint  Patient presents with   Medical Management of Chronic Issues    Follow up.     HPI  Discussed the use of AI scribe software for clinical note transcription with the patient, who gave verbal consent to proceed.  History of Present Illness   Alexander Mccormick is a 49 year old male who presents for medication titration of Mounjaro .  He is using Mounjaro  for weight management and has experienced no nausea with the medication, except for one instance of vomiting after consuming a large bowl of cereal, which he attributes to overeating.  He has lost approximately three pounds over the past month, averaging about a pound per week since starting the medication. He has used three out of four pens of his current dose and plans to finish the last pen before moving to the next dose. He is currently paying $45 for a month's supply of the medication.  He reports a decrease in appetite, stating he eats one to two meals a day and has not been craving sugary foods.  No current nausea but one episode of vomiting after overeating. Decreased appetite.      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    Objective:     BP 124/87 (BP  Location: Right Arm, Patient Position: Sitting, Cuff Size: Normal)   Pulse 98   Temp 97.9 F (36.6 C) (Oral)   Resp 16   Ht 5' 10 (1.778 m)   Wt 242 lb 9.6 oz (110 kg)   SpO2 97%   BMI 34.81 kg/m  BP Readings from Last 3 Encounters:  10/13/24 124/87  09/12/24 (!) 145/83  05/19/24 131/88   Wt Readings from Last 3 Encounters:  10/13/24 242 lb 9.6 oz (110 kg)  09/12/24 245 lb 3.2 oz (111.2 kg)  05/19/24 236 lb 14.4 oz (107.5 kg)   SpO2 Readings from Last 3 Encounters:  10/13/24 97%  09/12/24 95%  05/19/24 97%      Physical Exam Constitutional:      General: He is not in acute distress.    Appearance: Normal appearance. He is not ill-appearing.  HENT:     Head: Normocephalic and atraumatic.     Right Ear: External ear normal.     Left Ear: External ear normal.     Nose: Nose normal.  Eyes:     Conjunctiva/sclera: Conjunctivae normal.  Cardiovascular:     Rate and Rhythm: Normal rate and regular rhythm.     Pulses: Normal pulses.     Heart sounds: Normal heart sounds. No murmur heard.    No friction rub.  Pulmonary:     Effort: Pulmonary effort is normal. No respiratory  distress.     Breath sounds: Normal breath sounds. No wheezing, rhonchi or rales.  Skin:    General: Skin is warm and dry.     Coloration: Skin is not jaundiced or pale.  Neurological:     Mental Status: He is alert.  Psychiatric:        Mood and Affect: Mood normal.        Behavior: Behavior normal.      No results found for any visits on 10/13/24.  Last CBC Lab Results  Component Value Date   WBC 6.0 04/21/2024   HGB 14.9 04/21/2024   HCT 45.8 04/21/2024   MCV 97 04/21/2024   MCH 31.4 04/21/2024   RDW 12.5 04/21/2024   PLT 332 04/21/2024   Last metabolic panel Lab Results  Component Value Date   GLUCOSE 182 (H) 09/12/2024   NA 139 09/12/2024   K 4.6 09/12/2024   CL 99 09/12/2024   CO2 23 09/12/2024   BUN 13 09/12/2024   CREATININE 1.09 09/12/2024   EGFR 84 09/12/2024    CALCIUM 9.5 09/12/2024   PROT 7.1 09/12/2024   ALBUMIN 4.5 09/12/2024   LABGLOB 2.6 09/12/2024   BILITOT 0.3 09/12/2024   ALKPHOS 102 09/12/2024   AST 19 09/12/2024   ALT 20 09/12/2024   ANIONGAP 9 08/10/2021   Last lipids Lab Results  Component Value Date   CHOL 202 (H) 09/12/2024   HDL 48 09/12/2024   LDLCALC 128 (H) 09/12/2024   TRIG 143 09/12/2024   CHOLHDL 4.2 09/12/2024   Last hemoglobin A1c Lab Results  Component Value Date   HGBA1C 7.8 (H) 09/12/2024      The ASCVD Risk score (Arnett DK, et al., 2019) failed to calculate for the following reasons:   Risk score cannot be calculated because patient has a medical history suggesting prior/existing ASCVD   * - Cholesterol units were assumed    Assessment & Plan:   Assessment and Plan    Type 2 diabetes mellitus Chronic, not at goal- A1c of 7.8.Currently on Mounjaro  for weight management and glycemic control. No significant nausea reported, though one episode of vomiting occurred, likely due to overeating. Weight loss of approximately 3 pounds in the last month, averaging about 1 pound per week. Current dose is 2.5 mg, with plans to titrate up to 5 mg. Blood pressure is well-controlled at 124/87 mmHg, within the target range for type 2 diabetes management. - Continue Mounjaro , titrate to 5 mg after completing current 2.5 mg pen. - Ensure four doses of current pen level before increasing dose to prevent nausea. - Monitor for nausea and adjust dosage if necessary. - Consider multivitamin if dietary intake is reduced.  Essential hypertension Blood pressure is well-controlled at 124/87 mmHg. Current management is effective, and no changes are needed at this time. Discussed the importance of maintaining blood pressure below 130/80 mmHg for optimal diabetes management. Advised against lowering blood pressure further due to risk of fatigue and hypotension. - Continue current antihypertensive regimen. - Maintain low-salt diet  to help control blood pressure.  General health maintenance Discussed the importance of regular foot exams, especially given the history of diabetes. Last foot exam was conducted on December 31st, 2024, by Doctor Netty. - Will schedule foot exam in March. - Ensure regular pedicures to maintain foot health.      Return in about 3 months (around 01/11/2025) for DM.    Arliene Rosenow T Yakima Kreitzer, PA-C    [1]  Social History Tobacco Use  Smoking status: Never    Passive exposure: Never   Smokeless tobacco: Never  Vaping Use   Vaping status: Never Used  Substance Use Topics   Alcohol use: Not Currently   Drug use: Not Currently  [2]  Allergies Allergen Reactions   Sulfamethoxazole-Trimethoprim Rash   "

## 2024-10-31 ENCOUNTER — Telehealth (HOSPITAL_BASED_OUTPATIENT_CLINIC_OR_DEPARTMENT_OTHER): Payer: Self-pay | Admitting: Student

## 2024-10-31 NOTE — Telephone Encounter (Signed)
 Copied from CRM #[id number]. Topic: Clinical - Medication Refill >> Oct 31, 2024 10:29 AM Travis F wrote: Medication: pioglitazone (ACTOS) 45 MG tablet [[id number]]  Has the patient contacted their pharmacy? Yes  (Agent: If yes, when and what did the pharmacy advise?) Contact office, Pharmacy said patient needed an appointment but patient was just in on 10/13/24  This is the patient's preferred pharmacy:  [facility] - [place], [place] - [address] [place] [address] Phone: [phone] Fax: [phone]  Is this the correct pharmacy for this prescription? Yes If no, delete pharmacy and type the correct one.   Has the prescription been filled recently? Yes  Is the patient out of the medication? No  Has the patient been seen for an appointment in the last year OR does the patient have an upcoming appointment? Yes  Can we respond through MyChart? Yes  Agent: Please be advised that Rx refills may take up to 3 business days. We ask that you follow-up with your pharmacy.

## 2024-11-03 ENCOUNTER — Other Ambulatory Visit (HOSPITAL_BASED_OUTPATIENT_CLINIC_OR_DEPARTMENT_OTHER): Payer: Self-pay | Admitting: Family Medicine

## 2024-11-03 MED ORDER — PIOGLITAZONE HCL 45 MG PO TABS: 45.0000 mg | ORAL_TABLET | Freq: Every day | ORAL | 0 refills | Status: AC

## 2025-01-12 ENCOUNTER — Ambulatory Visit (HOSPITAL_BASED_OUTPATIENT_CLINIC_OR_DEPARTMENT_OTHER): Admitting: Student
